# Patient Record
Sex: Female | Born: 1975 | Race: White | Hispanic: No | Marital: Married | Smoking: Never smoker
Health system: Southern US, Community
[De-identification: ages and names within clinical notes are randomized; demographics above are authoritative.]

## PROBLEM LIST (undated history)

## (undated) DIAGNOSIS — M2550 Pain in unspecified joint: Secondary | ICD-10-CM

## (undated) DIAGNOSIS — R002 Palpitations: Secondary | ICD-10-CM

## (undated) DIAGNOSIS — D649 Anemia, unspecified: Secondary | ICD-10-CM

## (undated) DIAGNOSIS — K219 Gastro-esophageal reflux disease without esophagitis: Secondary | ICD-10-CM

## (undated) HISTORY — DX: Gastro-esophageal reflux disease without esophagitis: K21.9

## (undated) HISTORY — DX: Anemia, unspecified: D64.9

## (undated) HISTORY — PX: KNEE ARTHROSCOPY: SUR90

## (undated) HISTORY — PX: SHOULDER SURGERY: SHX246

## (undated) HISTORY — DX: Palpitations: R00.2

## (undated) HISTORY — DX: Pain in unspecified joint: M25.50

---

## 2008-12-22 LAB — CONVERTED CEMR LAB: Pap Smear: NORMAL

## 2009-06-30 ENCOUNTER — Ambulatory Visit: Payer: Self-pay | Admitting: Family Medicine

## 2009-06-30 ENCOUNTER — Encounter: Admission: RE | Admit: 2009-06-30 | Discharge: 2009-06-30 | Payer: Self-pay | Admitting: Family Medicine

## 2009-06-30 DIAGNOSIS — M25559 Pain in unspecified hip: Secondary | ICD-10-CM | POA: Insufficient documentation

## 2009-06-30 HISTORY — DX: Pain in unspecified hip: M25.559

## 2009-07-01 ENCOUNTER — Telehealth: Payer: Self-pay | Admitting: Family Medicine

## 2009-07-12 ENCOUNTER — Telehealth: Payer: Self-pay | Admitting: Family Medicine

## 2010-03-26 ENCOUNTER — Encounter: Payer: Self-pay | Admitting: Family Medicine

## 2010-04-04 NOTE — Progress Notes (Signed)
Summary: discuus results of Xray on hip  Phone Note Call from Patient Call back at Home Phone [phone] Call back at [phone]   Caller: Patient Call For: Nani Gasser MD Summary of Call: Pt calls and wants you to call her about the benign cyst seen on the xray of her hip. I tried telling her what it was but she wants to know how can they tell it is just a cyst and how can they tell its benign. She also called [facility] and they read the report to her then told hwer you could explain it to her Initial call taken by: Kathlene November,  July 01, 2009 9:12 AM  Follow-up for Phone Call        [facility] regarding results. Offered to repeat xray in 2-3 months to asses stability since she is clearly concerned.  Follow-up by: Nani Gasser MD,  Jul 04, 2009 11:47 AM

## 2010-04-04 NOTE — Assessment & Plan Note (Signed)
Summary: NOV: Left hip pain   Vital Signs:  Patient profile:   35 year old female Height:      68.5 inches Weight:      210 pounds BMI:     31.58 Pulse rate:   70 / minute BP sitting:   98 / 66  (left arm) Cuff size:   regular  Vitals Entered By: Kathlene November (June 30, 2009 8:29 AM) CC: NP- left hip pain for 1 1/2 weeks Is Patient Diabetic? No   Primary Care Provider:  Nani Gasser MD  CC:  NP- left hip pain for 1 1/2 weeks.  History of Present Illness: Left hip pain on ly with bearing weight. Started about 1/5 weeks ago. Now radiating down to her knee.  Occ the left hip will pop and seems to "pop out of joint" . Hx of lax joints.  Pain is worse in the AM when sleeps on that side.  Has been trying to wear flat shoes.  Taking IBU- does help.  Did try Aleve yesterday.  Never really happened before.  No numbness or tingling in the leg. Pain is mostly over the groin crease.    She is currently breast feeding.  No trauma or injury.   Habits & Providers  Alcohol-Tobacco-Diet     Alcohol drinks/day: <1     Alcohol type: wine     Tobacco Status: never  Exercise-Depression-Behavior     Does Patient Exercise: no     Have you felt down or hopeless? no     STD Risk: never     Drug Use: no     Seat Belt Use: always  Current Medications (verified): 1)  None  Allergies (verified): 1)  ! Pcn  Comments:  Nurse/Medical Assistant: The patient's medications and allergies were reviewed with the patient and were updated in the Medication and Allergy Lists. Kathlene November (June 30, 2009 8:31 AM)  Past History:  Past Medical History: NOne  Past Surgical History: Left shoulder surgery at aget 35 yo.   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 married to [place] with 2 kids.  Never Smoked Alcohol use-yes Drug use-no Regular  exercise-no 1 caffeinated drink per day. Smoking Status:  never Does Patient Exercise:  no STD Risk:  never Drug Use:  no Seat Belt Use:  always  Review of Systems       No fever/sweats/weakness, unexplained weight loss/gain.  No vison changes.  No difficulty hearing/ringing in ears, hay fever/allergies.  No chest pain/discomfort, palpitations.  No Br lump/nipple discharge.  No cough/wheeze.  No blood in BM, nausea/vomiting/diarrhea.  No nighttime urination, leaking urine, unusual vaginal bleeding, discharge (penis or vagina).  + muscle/joint pain. No rash, change in mole.  No HA, memory loss.  No anxiety, sleep d/o, depression.  No easy bruising/bleeding, unexplained lump   Physical Exam  General:  Well-developed,well-nourished,in no acute distress; alert,appropriate and cooperative throughout examination Msk:  hips with normal flexion, extension, inversion and everesion.  Pin with internal rotation of eth left hip.  Pain with flexion against resistance.  Strength 5/5 in the hips, knees and ankles bilat.  Nontedner over teh greater trochanter or lateral leg.  Extremities:  No Le Edema.   Neurologic:  alert & oriented X3 and gait normal.     Impression & Recommendations:  Problem # 1:  HIP  PAIN, LEFT (ICD-719.45) Discussed either true hip joint pain becaues of the location of her pain vs hip flexion strain.  Will get xrays today to rule out pathology. She also has hs of la joint with her hip "popping in and out" so she may have some cartilage damage as well. Can use her IBU 800mg  up to three times a day as needed . Right now she is using it 1-2 a day. Make sure to take with food.  Also given H.O. on hip exercises for flexor strain to work on for a couple of weeks assuming her xray is relatively normal. Then if not better pt to call and will either consider MRI or refer to ortho.  Orders: T-DG Hip Complete*L* ([id number])  Patient Instructions: 1)  Can take 800mg  Ibuprofen up to three times a  day with food and water.  2)  Given H.O on exercises for the hip as well.  3)  We wil call you with your xray results.   PAP Result Date:  12/22/2008 PAP Result:  normal

## 2010-04-04 NOTE — Progress Notes (Signed)
Summary: need an MRI  Phone Note Call from Patient   Caller: Patient Summary of Call: Dr,Kandee Escalante   Call [facility]  [phone]  Patient was seen a few weeks ago for hip pain and she is no better, was told to call back to get a Refferal for an MRI. Initial call taken by: Vanessa [place],  Jul 12, 2009 2:00 PM  Follow-up for Phone Call        Will schedule.  Follow-up by: Nani Gasser MD,  Jul 12, 2009 4:53 PM

## 2012-02-21 ENCOUNTER — Encounter: Payer: Self-pay | Admitting: Family Medicine

## 2012-02-21 ENCOUNTER — Ambulatory Visit (INDEPENDENT_AMBULATORY_CARE_PROVIDER_SITE_OTHER): Payer: Managed Care, Other (non HMO) | Admitting: Family Medicine

## 2012-02-21 VITALS — BP 118/77 | HR 67 | Ht 68.0 in | Wt 224.0 lb

## 2012-02-21 DIAGNOSIS — Z23 Encounter for immunization: Secondary | ICD-10-CM

## 2012-02-21 DIAGNOSIS — Z1231 Encounter for screening mammogram for malignant neoplasm of breast: Secondary | ICD-10-CM

## 2012-02-21 DIAGNOSIS — Z0000 Encounter for general adult medical examination without abnormal findings: Secondary | ICD-10-CM

## 2012-02-21 DIAGNOSIS — Z803 Family history of malignant neoplasm of breast: Secondary | ICD-10-CM

## 2012-02-21 NOTE — Progress Notes (Signed)
  Subjective:     Tracy Ford is a 36 y.o. female and is here for a comprehensive physical exam. The patient reports no problems. Needs form completed for work.   History   Social History  . Marital Status: Married    Spouse Name: N/A    Number of Children: 2  . 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 Smoking status: Never Smoker   . Smokeless tobacco: Not on file  . Alcohol Use: 1.0 oz/week    2 drink(s) per week  . Drug Use: Not on file  . Sexually Active: Not on file   Other Topics Concern  . Not on file   Social History Narrative   No regular exercise.  1 cup coffee daily.    Health Maintenance  Topic Date Due  . Tetanus/tdap  09/03/1993  . Pap Smear  03/06/2011  . Influenza Vaccine  11/04/2011    The following portions of the patient's history were reviewed and updated as appropriate: allergies, current medications, past family history, past medical history, past social history, past surgical history and problem list.  Review of Systems A comprehensive review of systems was negative.   Objective:    BP 118/77  Pulse 67  Ht 5\' 8"  (1.727 m)  Wt 224 lb (101.606 kg)  BMI 34.06 kg/m2 General appearance: alert, cooperative and appears stated age Head: Normocephalic, without obvious abnormality, atraumatic Eyes: conj clear, EOMi, PEERLA Ears: normal TM's and external ear canals both ears Nose: Nares normal. Septum midline. Mucosa normal. No drainage or sinus tenderness. Throat: lips, mucosa, and tongue normal; teeth and gums normal Neck: no adenopathy, no carotid bruit, no JVD, supple, symmetrical, trachea midline and thyroid not enlarged, symmetric, no tenderness/mass/nodules Back: symmetric, no curvature. ROM normal. No CVA tenderness. Lungs: clear to auscultation bilaterally Heart: regular rate and rhythm, S1, S2 normal, no murmur, click, rub or gallop Abdomen: soft, non-tender; bowel sounds  normal; no masses,  no organomegaly Extremities: extremities normal, atraumatic, no cyanosis or edema Pulses: 2+ and symmetric Skin: Skin color, texture, turgor normal. No rashes or lesions Lymph nodes: Cervical, supraclavicular, and axillary nodes normal. Neurologic: Alert and oriented X 3, normal strength and tone. Normal symmetric reflexes. Normal coordination and gait    Assessment:    Healthy female exam.      Plan:     See After Visit Summary for Counseling Recommendations   Plans on seeing gyn in next couple of month for her breast exam and pap smear.   With her family history of BrCA will start her mammogram screenings.   Keep up a regular exercise program and make sure you are eating a healthy diet Try to eat 4 servings of dairy a day, or if you are lactose intolerant take a calcium with vitamin D daily.  Tdap and flu vaccine given today.  Your vaccines are up to date.

## 2012-02-21 NOTE — Patient Instructions (Addendum)
Keep up a regular exercise program and make sure you are eating a healthy diet Try to eat 4 servings of dairy a day, or if you are lactose intolerant take a calcium with vitamin D daily.  Your vaccines are up to date.   

## 2012-04-29 ENCOUNTER — Ambulatory Visit: Payer: Managed Care, Other (non HMO)

## 2012-05-08 ENCOUNTER — Ambulatory Visit (INDEPENDENT_AMBULATORY_CARE_PROVIDER_SITE_OTHER): Payer: Managed Care, Other (non HMO)

## 2012-05-08 DIAGNOSIS — Z803 Family history of malignant neoplasm of breast: Secondary | ICD-10-CM

## 2012-05-08 DIAGNOSIS — Z1231 Encounter for screening mammogram for malignant neoplasm of breast: Secondary | ICD-10-CM

## 2013-02-24 ENCOUNTER — Ambulatory Visit (INDEPENDENT_AMBULATORY_CARE_PROVIDER_SITE_OTHER): Payer: Commercial Managed Care - PPO | Admitting: Family Medicine

## 2013-02-24 ENCOUNTER — Encounter: Payer: Self-pay | Admitting: Family Medicine

## 2013-02-24 VITALS — BP 120/73 | HR 76 | Temp 98.6°F | Ht 68.0 in | Wt 228.0 lb

## 2013-02-24 DIAGNOSIS — Z0000 Encounter for general adult medical examination without abnormal findings: Secondary | ICD-10-CM

## 2013-02-24 DIAGNOSIS — Z23 Encounter for immunization: Secondary | ICD-10-CM

## 2013-02-24 DIAGNOSIS — M25569 Pain in unspecified knee: Secondary | ICD-10-CM

## 2013-02-24 DIAGNOSIS — M25561 Pain in right knee: Secondary | ICD-10-CM

## 2013-02-24 DIAGNOSIS — Z803 Family history of malignant neoplasm of breast: Secondary | ICD-10-CM

## 2013-02-24 DIAGNOSIS — Z1231 Encounter for screening mammogram for malignant neoplasm of breast: Secondary | ICD-10-CM

## 2013-02-24 HISTORY — DX: Family history of malignant neoplasm of breast: Z80.3

## 2013-02-24 LAB — LIPID PANEL
Cholesterol: 182 mg/dL (ref 0–200)
HDL: 55 mg/dL (ref >39)
LDL Cholesterol: 104 mg/dL — ABNORMAL HIGH (ref 0–99)
Total CHOL/HDL Ratio: 3.3 Ratio
Triglycerides: 114 mg/dL (ref <150)
VLDL: 23 mg/dL (ref 0–40)

## 2013-02-24 LAB — COMPLETE METABOLIC PANEL WITH GFR
ALT: 20 U/L (ref 0–35)
AST: 16 U/L (ref 0–37)
Calcium: 9.1 mg/dL (ref 8.4–10.5)
Chloride: 104 mEq/L (ref 96–112)
Creat: 0.61 mg/dL (ref 0.50–1.10)
GFR, Est African American: >89 mL/min
Total Protein: 6.8 g/dL (ref 6.0–8.3)

## 2013-02-24 NOTE — Progress Notes (Addendum)
  Subjective:     Tracy Ford is a 37 y.o. female and is here for a comprehensive physical exam. The patient reports problems - right knee feels tight and swollen after went to trampoline park after Thanksiving. .has been swollen since then. Using Aleve and has been helped. Occ uncomfortable but not really in a lot of pain. Says didn't hurt during the jumping. Just got gradually swollen and sore afterwards.   History   Social History  . Marital Status: Married    Spouse Name: N/A    Number of Children: 2  . 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 Smoking status: Never Smoker   . Smokeless tobacco: Not on file  . Alcohol Use: 1.0 oz/week    2 drink(s) per week  . Drug Use: Not on file  . Sexual Activity: Not on file   Other Topics Concern  . Not on file   Social History Narrative   No regular exercise.  1 cup coffee daily.    Health Maintenance  Topic Date Due  . Pap Smear  12/23/2011  . Influenza Vaccine  10/03/2012  . Tetanus/tdap  02/20/2022    The following portions of the patient's history were reviewed and updated as appropriate: allergies, current medications, past family history, past medical history, past social history, past surgical history and problem list.  Review of Systems A comprehensive review of systems was negative.   Objective:    BP 120/73  Pulse 76  Temp(Src) 98.6 F (37 C)  Ht 5\' 8"  (1.727 m)  Wt 228 lb (103.42 kg)  BMI 34.68 kg/m2 General appearance: alert, cooperative and appears stated age Head: Normocephalic, without obvious abnormality, atraumatic Eyes: conj clear, EOMi, PEERLA Ears: normal TM's and external ear canals both ears Nose: Nares normal. Septum midline. Mucosa normal. No drainage or sinus tenderness. Throat: lips, mucosa, and tongue normal; teeth and gums normal Neck: no adenopathy, no carotid bruit, no JVD, supple, symmetrical, trachea midline and thyroid  not enlarged, symmetric, no tenderness/mass/nodules Back: symmetric, no curvature. ROM normal. No CVA tenderness. Lungs: clear to auscultation bilaterally Heart: regular rate and rhythm, S1, S2 normal, no murmur, click, rub or gallop Abdomen: soft, non-tender; bowel sounds normal; no masses,  no organomegaly Extremities: extremities normal, atraumatic, no cyanosis or edema Pulses: 2+ and symmetric Skin: Skin color, texture, turgor normal. No rashes or lesions Lymph nodes: Cervical, supraclavicular, and axillary nodes normal. Neurologic: Alert and oriented X 3, normal strength and tone. Normal symmetric reflexes. Normal coordination and gait   Right knee pain - Trace edema.  Normal flexion, extension, no crepitus.  Nontender along the joint lines.  Neg McMurrays.     Assessment:    Healthy female exam.      Plan:     See After Visit Summary for Counseling Recommendations  Keep up a regular exercise program and make sure you are eating a healthy diet Try to eat 4 servings of dairy a day, or if you are lactose intolerant take a calcium with vitamin D daily.  Your vaccines are up to date. Flu vaccine given today.  Neg depression screen.   Due for screening lipids/CMP.   Right knee Pain - Suspect cartilage tear.  Continue NSAID, icing, etc. She feel has been getting better, If not improved after 1 mo or swelling gets worse.  Will refer to Dr. Karie Schwalbe iuf needed.

## 2013-02-24 NOTE — Addendum Note (Signed)
Addended by: Nani Gasser D on: 02/24/2013 09:00 AM   Modules accepted: Orders, Level of Service

## 2013-02-24 NOTE — Patient Instructions (Signed)
Keep up a regular exercise program and make sure you are eating a healthy diet Try to eat 4 servings of dairy a day, or if you are lactose intolerant take a calcium with vitamin D daily.  Your vaccines are up to date.   

## 2013-03-10 ENCOUNTER — Telehealth: Payer: Self-pay | Admitting: *Deleted

## 2013-03-10 NOTE — Telephone Encounter (Signed)
Informed that Insurance may not pay for her to have mammo done due to her age. [name] will call and explain to pt.Tracy Ford, Tracy Ford

## 2013-05-12 ENCOUNTER — Ambulatory Visit (INDEPENDENT_AMBULATORY_CARE_PROVIDER_SITE_OTHER): Payer: Commercial Managed Care - PPO

## 2013-05-12 DIAGNOSIS — Z1231 Encounter for screening mammogram for malignant neoplasm of breast: Secondary | ICD-10-CM

## 2013-05-12 DIAGNOSIS — Z803 Family history of malignant neoplasm of breast: Secondary | ICD-10-CM

## 2013-12-11 ENCOUNTER — Ambulatory Visit (INDEPENDENT_AMBULATORY_CARE_PROVIDER_SITE_OTHER): Payer: Commercial Managed Care - PPO

## 2013-12-11 ENCOUNTER — Ambulatory Visit (INDEPENDENT_AMBULATORY_CARE_PROVIDER_SITE_OTHER): Payer: Commercial Managed Care - PPO | Admitting: Sports Medicine

## 2013-12-11 ENCOUNTER — Encounter: Payer: Self-pay | Admitting: Sports Medicine

## 2013-12-11 VITALS — BP 133/88 | HR 76 | Ht 68.0 in | Wt 230.0 lb

## 2013-12-11 DIAGNOSIS — M25461 Effusion, right knee: Secondary | ICD-10-CM

## 2013-12-11 DIAGNOSIS — M25561 Pain in right knee: Secondary | ICD-10-CM

## 2013-12-11 DIAGNOSIS — M25562 Pain in left knee: Secondary | ICD-10-CM

## 2013-12-11 HISTORY — DX: Pain in right knee: M25.561

## 2013-12-11 NOTE — Progress Notes (Signed)
   Subjective:    I'm seeing this patient as a consultation for:  Dr. Linford ArnoldMetheney  CC: Right knee pain  HPI: This is a very pleasant 38 year old female with a history of osteoarthritis who comes in with a several week history of pain and swelling in her right knee after starting a running program. Pain is moderate, persistent, under the kneecap as well as at the joint lines without radiation. No mechanical symptoms.  Past medical history, Surgical history, Family history not pertinant except as noted below, Social history, Allergies, and medications have been entered into the medical record, reviewed, and no changes needed.   Review of Systems: No headache, visual changes, nausea, vomiting, diarrhea, constipation, dizziness, abdominal pain, skin rash, fevers, chills, night sweats, weight loss, swollen lymph nodes, body aches, joint swelling, muscle aches, chest pain, shortness of breath, mood changes, visual or auditory hallucinations.   Objective:   General: Well Developed, well nourished, and in no acute distress.  Neuro/Psych: Alert and oriented x3, extra-ocular muscles intact, able to move all 4 extremities, sensation grossly intact. Skin: Warm and dry, no rashes noted.  Respiratory: Not using accessory muscles, speaking in full sentences, trachea midline.  Cardiovascular: Pulses palpable, no extremity edema. Abdomen: Does not appear distended.  Procedure: Real-time Ultrasound Guided aspiration/Injection of right knee Device: GE Logiq E  Verbal informed consent obtained.  Time-out conducted.  Noted no overlying erythema, induration, or other signs of local infection.  Skin prepped in a sterile fashion.  Local anesthesia: Topical Ethyl chloride.  With sterile technique and under real time ultrasound guidance:  Aspirated 25 cc of straw-colored fluid, syringe switched and 2 cc kenalog 40, 4 cc lidocaine injected easily. Completed without difficulty  Pain immediately resolved suggesting  accurate placement of the medication.  Advised to call if fevers/chills, erythema, induration, drainage, or persistent bleeding.  Images permanently stored and available for review in the ultrasound unit.  Impression: Technically successful ultrasound guided injection.  Impression and Recommendations:   This case required medical decision making of moderate complexity.

## 2013-12-11 NOTE — Assessment & Plan Note (Addendum)
Pain under the patellar facets with a significant effusion, and joint line pain. This likely represents a degenerative meniscal injury with osteoarthritis. Aspiration of 25cc and injection as above, physical therapy, x-rays, continue over-the-counter NSAIDs.

## 2013-12-14 ENCOUNTER — Ambulatory Visit: Payer: Commercial Managed Care - PPO | Admitting: Sports Medicine

## 2013-12-22 ENCOUNTER — Ambulatory Visit (INDEPENDENT_AMBULATORY_CARE_PROVIDER_SITE_OTHER): Payer: Commercial Managed Care - PPO | Admitting: Physical Therapy

## 2013-12-22 DIAGNOSIS — M25669 Stiffness of unspecified knee, not elsewhere classified: Secondary | ICD-10-CM

## 2013-12-22 DIAGNOSIS — R5381 Other malaise: Secondary | ICD-10-CM

## 2013-12-22 DIAGNOSIS — M25561 Pain in right knee: Secondary | ICD-10-CM

## 2013-12-28 ENCOUNTER — Encounter (INDEPENDENT_AMBULATORY_CARE_PROVIDER_SITE_OTHER): Payer: Commercial Managed Care - PPO | Admitting: Physical Therapy

## 2013-12-28 DIAGNOSIS — R5381 Other malaise: Secondary | ICD-10-CM

## 2013-12-28 DIAGNOSIS — M25669 Stiffness of unspecified knee, not elsewhere classified: Secondary | ICD-10-CM

## 2013-12-28 DIAGNOSIS — M25561 Pain in right knee: Secondary | ICD-10-CM

## 2013-12-30 ENCOUNTER — Encounter: Payer: Commercial Managed Care - PPO | Admitting: Physical Therapy

## 2014-01-04 ENCOUNTER — Encounter (INDEPENDENT_AMBULATORY_CARE_PROVIDER_SITE_OTHER): Payer: Commercial Managed Care - PPO | Admitting: Physical Therapy

## 2014-01-04 DIAGNOSIS — M25669 Stiffness of unspecified knee, not elsewhere classified: Secondary | ICD-10-CM

## 2014-01-04 DIAGNOSIS — M25561 Pain in right knee: Secondary | ICD-10-CM

## 2014-01-04 DIAGNOSIS — R5381 Other malaise: Secondary | ICD-10-CM

## 2014-01-06 ENCOUNTER — Encounter (INDEPENDENT_AMBULATORY_CARE_PROVIDER_SITE_OTHER): Payer: Commercial Managed Care - PPO | Admitting: Physical Therapy

## 2014-01-06 DIAGNOSIS — R5381 Other malaise: Secondary | ICD-10-CM

## 2014-01-06 DIAGNOSIS — M25669 Stiffness of unspecified knee, not elsewhere classified: Secondary | ICD-10-CM

## 2014-01-06 DIAGNOSIS — M25561 Pain in right knee: Secondary | ICD-10-CM

## 2014-01-11 ENCOUNTER — Encounter (INDEPENDENT_AMBULATORY_CARE_PROVIDER_SITE_OTHER): Payer: Commercial Managed Care - PPO | Admitting: Physical Therapy

## 2014-01-11 DIAGNOSIS — R5381 Other malaise: Secondary | ICD-10-CM

## 2014-01-11 DIAGNOSIS — M25561 Pain in right knee: Secondary | ICD-10-CM

## 2014-01-11 DIAGNOSIS — M25669 Stiffness of unspecified knee, not elsewhere classified: Secondary | ICD-10-CM

## 2014-01-14 ENCOUNTER — Telehealth: Payer: Self-pay | Admitting: *Deleted

## 2014-01-14 ENCOUNTER — Ambulatory Visit (INDEPENDENT_AMBULATORY_CARE_PROVIDER_SITE_OTHER): Payer: Commercial Managed Care - PPO | Admitting: Sports Medicine

## 2014-01-14 DIAGNOSIS — M25561 Pain in right knee: Secondary | ICD-10-CM

## 2014-01-14 NOTE — Progress Notes (Signed)
  Subjective:    CC: follow-up  HPI: Right knee pain: We aspirated and injected Kim's knee at the last visit, she had an injury while running. Pain is approximately 60% improved and swelling has not returned however she has no confidence in the knee and continues to have pain at the medial joint line. She is continuing to do physical therapy. Pain is mild, persistent. No mechanical symptoms.  Past medical history, Surgical history, Family history not pertinant except as noted below, Social history, Allergies, and medications have been entered into the medical record, reviewed, and no changes needed.   Review of Systems: No fevers, chills, night sweats, weight loss, chest pain, or shortness of breath.   Objective:    General: Well Developed, well nourished, and in no acute distress.  Neuro: Alert and oriented x3, extra-ocular muscles intact, sensation grossly intact.  HEENT: Normocephalic, atraumatic, pupils equal round reactive to light, neck supple, no masses, no lymphadenopathy, thyroid nonpalpable.  Skin: Warm and dry, no rashes. Cardiac: Regular rate and rhythm, no murmurs rubs or gallops, no lower extremity edema.  Respiratory: Clear to auscultation bilaterally. Not using accessory muscles, speaking in full sentences. Right Knee: Normal to inspection with no erythema or effusion or obvious bony abnormalities. Moderate tenderness at the medial joint line. ROM normal in flexion and extension and lower leg rotation. Ligaments with solid consistent endpoints including ACL, PCL, LCL, MCL. Negative Mcmurray's and provocative meniscal tests. Non painful patellar compression. Patellar and quadriceps tendons unremarkable. Hamstring and quadriceps strength is normal.  Impression and Recommendations:

## 2014-01-14 NOTE — Assessment & Plan Note (Signed)
Persistent pain despite aspiration, injection, and physical therapy. There is likely a complex medial meniscal tear. MRI, referral to orthopedic surgery.

## 2014-01-14 NOTE — Telephone Encounter (Signed)
No prior auth required for MRI knee as per [facility] portal. Corliss SkainsJamie Vyom Brass, CMA

## 2014-01-23 ENCOUNTER — Ambulatory Visit (HOSPITAL_BASED_OUTPATIENT_CLINIC_OR_DEPARTMENT_OTHER)
Admission: RE | Admit: 2014-01-23 | Discharge: 2014-01-23 | Disposition: A | Discharge status: Home / Self Care | Payer: Commercial Managed Care - PPO | Source: Ambulatory Visit | Attending: Sports Medicine | Admitting: Sports Medicine

## 2014-01-23 DIAGNOSIS — X58XXXA Exposure to other specified factors, initial encounter: Secondary | ICD-10-CM | POA: Insufficient documentation

## 2014-01-23 DIAGNOSIS — S83241A Other tear of medial meniscus, current injury, right knee, initial encounter: Secondary | ICD-10-CM | POA: Diagnosis not present

## 2014-01-23 DIAGNOSIS — M25561 Pain in right knee: Secondary | ICD-10-CM | POA: Insufficient documentation

## 2014-01-23 DIAGNOSIS — M25461 Effusion, right knee: Secondary | ICD-10-CM | POA: Diagnosis not present

## 2014-01-23 DIAGNOSIS — Y9302 Activity, running: Secondary | ICD-10-CM | POA: Diagnosis not present

## 2014-01-23 DIAGNOSIS — M25661 Stiffness of right knee, not elsewhere classified: Secondary | ICD-10-CM | POA: Diagnosis not present

## 2014-02-23 ENCOUNTER — Ambulatory Visit (INDEPENDENT_AMBULATORY_CARE_PROVIDER_SITE_OTHER): Payer: Commercial Managed Care - PPO | Admitting: Family Medicine

## 2014-02-23 ENCOUNTER — Other Ambulatory Visit (HOSPITAL_COMMUNITY)
Admission: RE | Admit: 2014-02-23 | Discharge: 2014-02-23 | Disposition: A | Discharge status: Home / Self Care | Payer: Commercial Managed Care - PPO | Source: Ambulatory Visit | Attending: Family Medicine | Admitting: Family Medicine

## 2014-02-23 ENCOUNTER — Encounter: Payer: Self-pay | Admitting: Family Medicine

## 2014-02-23 VITALS — BP 123/81 | HR 68 | Ht 68.0 in | Wt 230.0 lb

## 2014-02-23 DIAGNOSIS — Z01419 Encounter for gynecological examination (general) (routine) without abnormal findings: Secondary | ICD-10-CM | POA: Insufficient documentation

## 2014-02-23 DIAGNOSIS — Z803 Family history of malignant neoplasm of breast: Secondary | ICD-10-CM

## 2014-02-23 DIAGNOSIS — Z1151 Encounter for screening for human papillomavirus (HPV): Secondary | ICD-10-CM | POA: Diagnosis present

## 2014-02-23 DIAGNOSIS — Z1231 Encounter for screening mammogram for malignant neoplasm of breast: Secondary | ICD-10-CM

## 2014-02-23 LAB — POCT GLYCOSYLATED HEMOGLOBIN (HGB A1C): HEMOGLOBIN A1C: 5

## 2014-02-23 NOTE — Patient Instructions (Signed)
Keep up a regular exercise program and make sure you are eating a healthy diet Try to eat 4 servings of dairy a day, or if you are lactose intolerant take a calcium with vitamin D daily.  Your vaccines are up to date.   

## 2014-02-23 NOTE — Progress Notes (Signed)
  Subjective:     Tracy Ford is a 38 y.o. female and is here for a comprehensive physical exam. The patient reports no problems. She reports normal menstrual cycles and no abnormalities with discharge or pelvic pain. No constipation or bowel pounds. No recent chest pain or short of breath with exercise. No recent hearing or vision changes. Her flu vaccine is up-to-date. Tetanus is up-to-date.  History   Social History  . Marital Status: Married    Spouse Name: N/A    Number of Children: 2  . 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 Smoking status: Never Smoker   . Smokeless tobacco: Not on file  . Alcohol Use: 1.0 oz/week    2 drink(s) per week  . Drug Use: Not on file  . Sexual Activity: Not on file   Other Topics Concern  . Not on file   Social History Narrative   No regular exercise.  1 cup coffee daily.    Health Maintenance  Topic Date Due  . PAP SMEAR  12/23/2011  . INFLUENZA VACCINE  10/04/2014  . TETANUS/TDAP  02/20/2022    The following portions of the patient's history were reviewed and updated as appropriate: allergies, current medications, past family history, past medical history, past social history, past surgical history and problem list.  Review of Systems A comprehensive review of systems was negative.   Objective:    BP 123/81 mmHg  Pulse 68  Ht 5\' 8"  (1.727 m)  Wt 230 lb (104.327 kg)  BMI 34.98 kg/m2  SpO2 99% General appearance: alert, cooperative and appears stated age Head: Normocephalic, without obvious abnormality, atraumatic Eyes: conj clear, EOMI, PEERLA Ears: normal TM's and external ear canals both ears Nose: Nares normal. Septum midline. Mucosa normal. No drainage or sinus tenderness. Throat: lips, mucosa, and tongue normal; teeth and gums normal Neck: no adenopathy, no carotid bruit, no JVD, supple, symmetrical, trachea midline and thyroid not enlarged, symmetric,  no tenderness/mass/nodules Back: symmetric, no curvature. ROM normal. No CVA tenderness. Lungs: clear to auscultation bilaterally Breasts: normal appearance, no masses or tenderness Heart: regular rate and rhythm, S1, S2 normal, no murmur, click, rub or gallop Abdomen: soft, non-tender; bowel sounds normal; no masses,  no organomegaly Pelvic: cervix normal in appearance, external genitalia normal, no adnexal masses or tenderness, no cervical motion tenderness, rectovaginal septum normal, uterus normal size, shape, and consistency and vagina normal without discharge Extremities: extremities normal, atraumatic, no cyanosis or edema Pulses: 2+ and symmetric Skin: Skin color, texture, turgor normal. No rashes or lesions Lymph nodes: Cervical, supraclavicular, and axillary nodes normal. Neurologic: Alert and oriented X 3, normal strength and tone. Normal symmetric reflexes. Normal coordination and gait    Assessment:    Healthy female exam.      Plan:     See After Visit Summary for Counseling Recommendations  Keep up a regular exercise program and make sure you are eating a healthy diet Try to eat 4 servings of dairy a day, or if you are lactose intolerant take a calcium with vitamin D daily.  Your vaccines are up to date.

## 2014-02-24 LAB — CYTOLOGY - PAP

## 2014-10-27 ENCOUNTER — Encounter: Payer: Self-pay | Admitting: *Deleted

## 2014-10-27 ENCOUNTER — Emergency Department (INDEPENDENT_AMBULATORY_CARE_PROVIDER_SITE_OTHER)
Admission: EM | Admit: 2014-10-27 | Discharge: 2014-10-27 | Disposition: A | Discharge status: Home / Self Care | Payer: Commercial Managed Care - PPO | Source: Home / Self Care

## 2014-10-27 DIAGNOSIS — Z23 Encounter for immunization: Secondary | ICD-10-CM | POA: Diagnosis not present

## 2014-10-27 MED ORDER — TETANUS-DIPHTH-ACELL PERTUSSIS 5-2.5-18.5 LF-MCG/0.5 IM SUSP
0.5000 mL | Freq: Once | INTRAMUSCULAR | Status: AC
  Administered 2014-10-27: 0.5 mL via INTRAMUSCULAR

## 2014-10-27 NOTE — ED Notes (Signed)
Tracy Ford is here today for a Tdap vaccine.

## 2016-03-20 ENCOUNTER — Other Ambulatory Visit: Payer: Self-pay

## 2016-03-20 ENCOUNTER — Other Ambulatory Visit (HOSPITAL_COMMUNITY)
Admission: RE | Admit: 2016-03-20 | Discharge: 2016-03-20 | Disposition: A | Discharge status: Home / Self Care | Payer: Managed Care, Other (non HMO) | Source: Ambulatory Visit | Attending: Family Medicine | Admitting: Family Medicine

## 2016-03-20 ENCOUNTER — Encounter: Payer: Self-pay | Admitting: Family Medicine

## 2016-03-20 ENCOUNTER — Ambulatory Visit (INDEPENDENT_AMBULATORY_CARE_PROVIDER_SITE_OTHER): Payer: Managed Care, Other (non HMO) | Admitting: Family Medicine

## 2016-03-20 VITALS — BP 112/70 | HR 65 | Ht 68.0 in | Wt 204.0 lb

## 2016-03-20 DIAGNOSIS — Z01419 Encounter for gynecological examination (general) (routine) without abnormal findings: Secondary | ICD-10-CM | POA: Insufficient documentation

## 2016-03-20 DIAGNOSIS — N841 Polyp of cervix uteri: Secondary | ICD-10-CM | POA: Diagnosis not present

## 2016-03-20 DIAGNOSIS — Z0000 Encounter for general adult medical examination without abnormal findings: Secondary | ICD-10-CM

## 2016-03-20 DIAGNOSIS — Z114 Encounter for screening for human immunodeficiency virus [HIV]: Secondary | ICD-10-CM | POA: Diagnosis not present

## 2016-03-20 DIAGNOSIS — Z1231 Encounter for screening mammogram for malignant neoplasm of breast: Secondary | ICD-10-CM | POA: Diagnosis not present

## 2016-03-20 LAB — COMPLETE METABOLIC PANEL WITH GFR
ALBUMIN: 4.2 g/dL (ref 3.6–5.1)
ALK PHOS: 48 U/L (ref 33–115)
ALT: 22 U/L (ref 6–29)
AST: 15 U/L (ref 10–30)
BILIRUBIN TOTAL: 0.5 mg/dL (ref 0.2–1.2)
BUN: 10 mg/dL (ref 7–25)
CO2: 27 mmol/L (ref 20–31)
Calcium: 9.1 mg/dL (ref 8.6–10.2)
Chloride: 104 mmol/L (ref 98–110)
Creat: 0.68 mg/dL (ref 0.50–1.10)
GFR, Est African American: >89 mL/min (ref >=60)
GFR, Est Non African American: >89 mL/min (ref >=60)
GLUCOSE: 91 mg/dL (ref 65–99)
POTASSIUM: 4.1 mmol/L (ref 3.5–5.3)
SODIUM: 140 mmol/L (ref 135–146)
TOTAL PROTEIN: 6.9 g/dL (ref 6.1–8.1)

## 2016-03-20 LAB — LIPID PANEL
CHOL/HDL RATIO: 2.6 ratio (ref <5.0)
Cholesterol: 161 mg/dL (ref <200)
HDL: 61 mg/dL (ref >50)
LDL CALC: 80 mg/dL (ref <100)
Triglycerides: 98 mg/dL (ref <150)
VLDL: 20 mg/dL (ref <30)

## 2016-03-20 LAB — HEMOGLOBIN A1C
HEMOGLOBIN A1C: 4.5 % (ref <5.7)
MEAN PLASMA GLUCOSE: 82 mg/dL

## 2016-03-20 NOTE — Patient Instructions (Signed)
Keep up a regular exercise program and make sure you are eating a healthy diet Try to eat 4 servings of dairy a day, or if you are lactose intolerant take a calcium with vitamin D daily.  Your vaccines are up to date.   

## 2016-03-20 NOTE — Addendum Note (Signed)
Addended by: Deno EtienneBARKLEY, Desjuan Stearns L on: 03/20/2016 09:50 AM   Modules accepted: Orders

## 2016-03-20 NOTE — Progress Notes (Signed)
Subjective:     Tracy Ford is a 41 y.o. female and is here for a comprehensive physical exam. The patient reports no problems.  She is doing well overall. She has lost about 30 pounds since I last saw her several years ago. She is currently doing Weight Watchers. She's not actively exercising but plans to. She does take a multivitamin daily.  Social History   Social History  . Marital status: Married    Spouse name: Joe  . Number of children: 2  . Years of education: N/A   Occupational History  . 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 Smoking status: Never Smoker  . Smokeless tobacco: Not on file  . Alcohol use 1.0 oz/week    2 Standard drinks or equivalent per week  . Drug use: No  . Sexual activity: Not on file   Other Topics Concern  . Not on file   Social History Narrative   Some regular exercise.  1 cup coffee daily.    Health Maintenance  Topic Date Due  . HIV Screening  09/03/1989  . INFLUENZA VACCINE  10/04/2015  . PAP SMEAR  02/23/2017  . TETANUS/TDAP  10/26/2024    The following portions of the patient's history were reviewed and updated as appropriate: allergies, current medications, past family history, past medical history, past social history, past surgical history and problem list.  Review of Systems A comprehensive review of systems was negative.   Objective:    BP 112/70   Pulse 65   Ht 5\' 8"  (1.727 m)   Wt 204 lb (92.5 kg)   SpO2 100%   BMI 31.02 kg/m  General appearance: alert, cooperative and appears stated age Head: Normocephalic, without obvious abnormality, atraumatic Eyes: conj clear, EOMI, PEERLA Ears: normal TM's and external ear canals both ears Nose: Nares normal. Septum midline. Mucosa normal. No drainage or sinus tenderness. Throat: lips, mucosa, and tongue normal; teeth and gums normal Neck: no adenopathy, no carotid bruit, no JVD, supple, symmetrical, trachea midline and thyroid not enlarged, symmetric, no  tenderness/mass/nodules Back: symmetric, no curvature. ROM normal. No CVA tenderness. Lungs: clear to auscultation bilaterally Breasts: normal appearance, no masses or tenderness Heart: regular rate and rhythm, S1, S2 normal, no murmur, click, rub or gallop Abdomen: soft, non-tender; bowel sounds normal; no masses,  no organomegaly Pelvic: cervix normal in appearance, external genitalia normal, no adnexal masses or tenderness, no cervical motion tenderness, rectovaginal septum normal, uterus normal size, shape, and consistency, vagina normal without discharge and approx  < 1 cm cervical polyp Extremities: extremities normal, atraumatic, no cyanosis or edema Pulses: 2+ and symmetric Skin: Skin color, texture, turgor normal. No rashes or lesions Lymph nodes: Cervical, supraclavicular, and axillary nodes normal. Neurologic: Alert and oriented X 3, normal strength and tone. Normal symmetric reflexes. Normal coordination and gait    Procedure:  Patient identified of the presence of the polyp. I let her know that it would cause some discomfort and a little bleeding. Use forceps to pinch off the polyp and sent for pathology.   Assessment:    Healthy female exam.      Plan:     See After Visit Summary for Counseling Recommendations   complete physical examination Keep up a regular exercise program and make sure you are eating a healthy diet Try to eat 4 servings of dairy a day, or if you are lactose intolerant take a calcium with vitamin D daily.  Your vaccines are  up to date.  Will schedule Mammo   Cervical Polyp - Forceps used to clip and remove the polyp. Sent to pathology for further evaluation. Call with results once available.

## 2016-03-21 LAB — CYTOLOGY - PAP: Diagnosis: NEGATIVE

## 2016-03-21 LAB — HIV ANTIBODY (ROUTINE TESTING W REFLEX): HIV 1&2 Ab, 4th Generation: NONREACTIVE

## 2016-03-23 ENCOUNTER — Ambulatory Visit (INDEPENDENT_AMBULATORY_CARE_PROVIDER_SITE_OTHER): Payer: Managed Care, Other (non HMO)

## 2016-03-23 DIAGNOSIS — R928 Other abnormal and inconclusive findings on diagnostic imaging of breast: Secondary | ICD-10-CM

## 2016-03-23 DIAGNOSIS — Z1231 Encounter for screening mammogram for malignant neoplasm of breast: Secondary | ICD-10-CM

## 2016-03-27 ENCOUNTER — Other Ambulatory Visit: Payer: Self-pay | Admitting: Family Medicine

## 2016-03-27 DIAGNOSIS — R928 Other abnormal and inconclusive findings on diagnostic imaging of breast: Secondary | ICD-10-CM

## 2016-04-02 ENCOUNTER — Ambulatory Visit
Admission: RE | Admit: 2016-04-02 | Discharge: 2016-04-02 | Disposition: A | Discharge status: Home / Self Care | Payer: Managed Care, Other (non HMO) | Source: Ambulatory Visit | Attending: Family Medicine | Admitting: Family Medicine

## 2016-04-02 DIAGNOSIS — R928 Other abnormal and inconclusive findings on diagnostic imaging of breast: Secondary | ICD-10-CM

## 2016-04-18 ENCOUNTER — Encounter: Payer: Self-pay | Admitting: Emergency Medicine

## 2016-04-18 ENCOUNTER — Emergency Department (INDEPENDENT_AMBULATORY_CARE_PROVIDER_SITE_OTHER)
Admission: EM | Admit: 2016-04-18 | Discharge: 2016-04-18 | Disposition: A | Discharge status: Home / Self Care | Payer: Managed Care, Other (non HMO) | Source: Home / Self Care | Attending: Family Medicine | Admitting: Family Medicine

## 2016-04-18 DIAGNOSIS — J019 Acute sinusitis, unspecified: Secondary | ICD-10-CM | POA: Diagnosis not present

## 2016-04-18 MED ORDER — FLUTICASONE PROPIONATE 50 MCG/ACT NA SUSP: 2.0000 | Freq: Every day | NASAL | 2 refills | Status: AC

## 2016-04-18 MED ORDER — AZITHROMYCIN 250 MG PO TABS: 250.0000 mg | ORAL_TABLET | Freq: Every day | ORAL | 0 refills | Status: DC

## 2016-04-18 NOTE — ED Provider Notes (Signed)
CSN: [id number]     Arrival date & time 04/18/16  4098 History   First MD Initiated Contact with Patient 04/18/16 [phone]     Chief Complaint  Patient presents with  . Facial Pain   (Consider location/radiation/quality/duration/timing/severity/associated sxs/prior Treatment) HPI  Tracy Ford is a 41 y.o. female presenting to UC with c/o 1 week of gradually worsening facial pain and pressure with sinus congestion. Pain is worse on Left side of face. Upper teeth have also been sore. Mild intermittent productive cough from post-nasal drip.  Denies known fever. Denies n/v/d.  No known sick contacts or recent travel.    No past medical history on file. Past Surgical History:  Procedure Laterality Date  . KNEE ARTHROSCOPY     right knee for torn meniscus, Dr. Luiz Blare  . SHOULDER SURGERY     Left shoulder or chonic dislocations   Family History  Problem Relation Age of Onset  . Breast cancer Mother 47    x 2  . Prostate cancer Maternal Uncle   . Stroke Paternal Grandmother   . Alzheimer's disease Paternal Grandmother    Social History  Substance Use Topics  . Smoking status: Never Smoker  . Smokeless tobacco: Never Used  . Alcohol use 1.0 oz/week    2 Standard drinks or equivalent per week   OB History    No data available     Review of Systems  Constitutional: Negative for chills and fever.  HENT: Positive for congestion, rhinorrhea, sinus pain and sinus pressure. Negative for ear pain, sore throat, trouble swallowing and voice change.   Respiratory: Positive for cough. Negative for shortness of breath.   Cardiovascular: Negative for chest pain and palpitations.  Gastrointestinal: Negative for abdominal pain, diarrhea, nausea and vomiting.  Musculoskeletal: Negative for arthralgias, back pain and myalgias.  Skin: Negative for rash.  Neurological: Positive for headaches ( frontal). Negative for dizziness and light-headedness.    Allergies  Penicillins  Home Medications    Prior to Admission medications   Medication Sig Start Date End Date Taking? Authorizing Provider  azithromycin (ZITHROMAX) 250 MG tablet Take 1 tablet (250 mg total) by mouth daily. Take first 2 tablets together, then 1 every day until finished. 04/18/16   Junius Finner, PA-C  fluticasone (FLONASE) 50 MCG/ACT nasal spray Place 2 sprays into both nostrils daily. 04/18/16   Junius Finner, PA-C   Meds Ordered and Administered this Visit  Medications - No data to display  BP 126/87 (BP Location: Left Arm)   Pulse 65   Temp 97.6 F (36.4 C) (Oral)   Ht 5\' 8"  (1.727 m)   Wt 206 lb (93.4 kg)   LMP 04/11/2016 (Approximate)   SpO2 100%   BMI 31.32 kg/m  No data found.   Physical Exam  Constitutional: She is oriented to person, place, and time. She appears well-developed and well-nourished. No distress.  HENT:  Head: Normocephalic and atraumatic.  Right Ear: Tympanic membrane normal.  Left Ear: Tympanic membrane normal.  Nose: Mucosal edema present. Right sinus exhibits no maxillary sinus tenderness and no frontal sinus tenderness. Left sinus exhibits maxillary sinus tenderness and frontal sinus tenderness.  Mouth/Throat: Uvula is midline, oropharynx is clear and moist and mucous membranes are normal.  Eyes: EOM are normal.  Neck: Normal range of motion. Neck supple.  Cardiovascular: Normal rate and regular rhythm.   Pulmonary/Chest: Effort normal and breath sounds normal. No stridor. No respiratory distress. She has no wheezes. She has no rales.  Musculoskeletal: Normal  range of motion.  Lymphadenopathy:    She has no cervical adenopathy.  Neurological: She is alert and oriented to person, place, and time.  Skin: Skin is warm and dry. She is not diaphoretic.  Psychiatric: She has a normal mood and affect. Her behavior is normal.  Nursing note and vitals reviewed.   Urgent Care Course     Procedures (including critical care time)  Labs Review Labs Reviewed - No data to  display  Imaging Review No results found.   MDM   1. Acute rhinosinusitis    Hx and exam c/w sinusitis. Pt allergic to PCN, has done well on Azithromycin in the past.  Rx: Azithromycin and Flonase. Encouraged sinus rinses. F/u with PCP in 1 week if not improving.     Junius Finnerrin O'Malley, PA-C 04/18/16 1202

## 2016-04-18 NOTE — ED Triage Notes (Signed)
Congestion, facial pain, teeth hurt, cough x 1 week

## 2018-01-20 LAB — LIPID PANEL
Cholesterol: 193 (ref 0–200)
HDL: 68 (ref 35–70)
LDL Cholesterol: 109
Triglycerides: 82 (ref 40–160)

## 2018-01-20 LAB — BASIC METABOLIC PANEL: Glucose: 105

## 2019-02-04 ENCOUNTER — Encounter: Payer: Self-pay | Admitting: Family Medicine

## 2019-02-20 ENCOUNTER — Emergency Department (INDEPENDENT_AMBULATORY_CARE_PROVIDER_SITE_OTHER)
Admission: EM | Admit: 2019-02-20 | Discharge: 2019-02-20 | Disposition: A | Discharge status: Home / Self Care | Payer: Managed Care, Other (non HMO) | Source: Home / Self Care | Attending: Family Medicine | Admitting: Family Medicine

## 2019-02-20 ENCOUNTER — Encounter: Payer: Self-pay | Admitting: Emergency Medicine

## 2019-02-20 ENCOUNTER — Other Ambulatory Visit: Payer: Self-pay

## 2019-02-20 DIAGNOSIS — Z20828 Contact with and (suspected) exposure to other viral communicable diseases: Secondary | ICD-10-CM

## 2019-02-20 DIAGNOSIS — Z20822 Contact with and (suspected) exposure to covid-19: Secondary | ICD-10-CM

## 2019-02-20 NOTE — ED Triage Notes (Signed)
PT is here for  covid test, her boss tsted POS yesterday, She is asymtomatic

## 2019-02-20 NOTE — ED Provider Notes (Signed)
[facility]    CSN: [id number] Arrival date & time: 02/20/19  0920      History   Chief Complaint Chief Complaint  Patient presents with  . Exposure to COVID    HPI Tracy Ford is a 43 y.o. female.   Patient desires COVID19 testing.  She reports that her boss has tested positive for COVID19, but she is completely assymptomatic at present.  The history is provided by the patient.    History reviewed. No pertinent past medical history.  Patient Active Problem List   Diagnosis Date Noted  . Right knee pain 12/11/2013  . Family history of breast cancer in first degree relative 02/24/2013  . HIP PAIN, LEFT 06/30/2009    Past Surgical History:  Procedure Laterality Date  . KNEE ARTHROSCOPY     right knee for torn meniscus, Dr. Luiz Blare  . SHOULDER SURGERY     Left shoulder or chonic dislocations    OB History   No obstetric history on file.      Home Medications    Prior to Admission medications   Medication Sig Start Date End Date Taking? Authorizing Provider  fluticasone (FLONASE) 50 MCG/ACT nasal spray Place 2 sprays into both nostrils daily. 04/18/16   Lurene Shadow, PA-C    Family History Family History  Problem Relation Age of Onset  . Breast cancer Mother 47       x 2  . Prostate cancer Maternal Uncle   . Stroke Paternal Grandmother   . Alzheimer's disease Paternal Grandmother     Social History Social History   Tobacco Use  . Smoking status: Never Smoker  . Smokeless tobacco: Never Used  Substance Use Topics  . Alcohol use: Yes    Alcohol/week: 2.0 standard drinks    Types: 2 Standard drinks or equivalent per week  . Drug use: No     Allergies   Penicillins   Review of Systems Review of Systems No sore throat No cough No pleuritic pain No wheezing No nasal congestion No post-nasal drainage No sinus pain/pressure No itchy/red eyes No earache No hemoptysis No SOB No fever/chills No nausea No vomiting No  abdominal pain No diarrhea No urinary symptoms No skin rash No fatigue No myalgias No headache   Physical Exam Triage Vital Signs ED Triage Vitals  Enc Vitals Group     BP 02/20/19 0943 125/84     Pulse Rate 02/20/19 0943 78     Resp --      Temp 02/20/19 0943 98.7 F (37.1 C)     Temp Source 02/20/19 0943 Oral     SpO2 02/20/19 0943 99 %     Weight 02/20/19 0944 240 lb (108.9 kg)     Height 02/20/19 0944 5\' 8"  (1.727 m)     Head Circumference --      Peak Flow --      Pain Score 02/20/19 0944 0     Pain Loc --      Pain Edu? --      Excl. in GC? --    No data found.  Updated Vital Signs BP 125/84 (BP Location: Right Arm)   Pulse 78   Temp 98.7 F (37.1 C) (Oral)   Ht 5\' 8"  (1.727 m)   Wt 108.9 kg   SpO2 99%   BMI 36.49 kg/m   Visual Acuity Right Eye Distance:   Left Eye Distance:   Bilateral Distance:    Right Eye Near:  Left Eye Near:    Bilateral Near:     Physical Exam Vitals and nursing note reviewed.  Constitutional:      General: She is not in acute distress. Neurological:     Mental Status: She is alert.   Patient not examined otherwise   UC Treatments / Results  Labs (all labs ordered are listed, but only abnormal results are displayed) Labs Reviewed  NOVEL CORONAVIRUS, NAA    EKG   Radiology No results found.  Procedures Procedures (including critical care time)  Medications Ordered in UC Medications - No data to display  Initial Impression / Assessment and Plan / UC Course  I have reviewed the triage vital signs and the nursing notes.  Pertinent labs & imaging results that were available during my care of the patient were reviewed by me and considered in my medical decision making (see chart for details).    Patient assymptomatic at present. COVID19 send out   Final Clinical Impressions(s) / UC Diagnoses   Final diagnoses:  Close exposure to COVID-19 virus     Discharge Instructions     Isolate yourself until  COVID-19 test result is available.   If your COVID19 test is positive, then you are infected with the novel coronavirus and could give the virus to others.  Please continue isolation at home for at least 10 days since the start of your symptoms. If you do not have symptoms, please isolate at home for 10 days from the day you were tested. Once you complete your 10 day quarantine, you may return to normal activities as long as you've not had a fever for over 24 hours (without taking fever reducing medicine) and your symptoms are improving. Please continue good preventive care measures, including:  frequent hand-washing, avoid touching your face, cover coughs/sneezes, stay out of crowds and keep a 6 foot distance from others.  Go to the nearest hospital emergency room if fever/cough/breathlessness are severe or illness seems like a threat to life.     ED Prescriptions    None        Kandra Nicolas, MD 02/20/19 1037

## 2019-02-20 NOTE — Discharge Instructions (Addendum)
Isolate yourself until COVID-19 test result is available.   If your COVID19 test is positive, then you are infected with the novel coronavirus and could give the virus to others.  Please continue isolation at home for at least 10 days since the start of your symptoms. If you do not have symptoms, please isolate at home for 10 days from the day you were tested. Once you complete your 10 day quarantine, you may return to normal activities as long as you've not had a fever for over 24 hours (without taking fever reducing medicine) and your symptoms are improving. Please continue good preventive care measures, including:  frequent hand-washing, avoid touching your face, cover coughs/sneezes, stay out of crowds and keep a 6 foot distance from others.  Go to the nearest hospital emergency room if fever/cough/breathlessness are severe or illness seems like a threat to life.  

## 2019-02-22 LAB — NOVEL CORONAVIRUS, NAA: SARS-CoV-2, NAA: NOT DETECTED

## 2019-11-02 ENCOUNTER — Encounter: Payer: Self-pay | Admitting: Emergency Medicine

## 2019-11-02 ENCOUNTER — Other Ambulatory Visit: Payer: Self-pay

## 2019-11-02 ENCOUNTER — Emergency Department (INDEPENDENT_AMBULATORY_CARE_PROVIDER_SITE_OTHER)
Admission: EM | Admit: 2019-11-02 | Discharge: 2019-11-02 | Disposition: A | Discharge status: Home / Self Care | Payer: No Typology Code available for payment source | Source: Home / Self Care

## 2019-11-02 DIAGNOSIS — M7021 Olecranon bursitis, right elbow: Secondary | ICD-10-CM | POA: Diagnosis not present

## 2019-11-02 MED ORDER — CEPHALEXIN 500 MG PO CAPS: 500.0000 mg | ORAL_CAPSULE | Freq: Two times a day (BID) | ORAL | 0 refills | Status: DC

## 2019-11-02 NOTE — ED Provider Notes (Signed)
[facility]    CSN: [id number] Arrival date & time: 11/02/19  0037      History   Chief Complaint Chief Complaint  Patient presents with  . Elbow Pain    HPI Tracy Ford is a 44 y.o. female.   HPI  Tracy Ford is a 44 y.o. female presenting to UC with c/o 4 days of gradually worsening Right elbow pain, redness, warmth and swelling.  No known injury. Pain is mildly achy, worse when touched. Denies fever, chills, n/v/d. No hx of similar symptoms.    History reviewed. No pertinent past medical history.  Patient Active Problem List   Diagnosis Date Noted  . Right knee pain 12/11/2013  . Family history of breast cancer in first degree relative 02/24/2013  . HIP PAIN, LEFT 06/30/2009    Past Surgical History:  Procedure Laterality Date  . KNEE ARTHROSCOPY     right knee for torn meniscus, Dr. Luiz Blare  . SHOULDER SURGERY     Left shoulder or chonic dislocations    OB History   No obstetric history on file.      Home Medications    Prior to Admission medications   Medication Sig Start Date End Date Taking? Authorizing Provider  cetirizine (ZYRTEC) 10 MG chewable tablet Chew 10 mg by mouth daily.   Yes [provider]  ibuprofen (ADVIL) 200 MG tablet Take 200 mg by mouth every 6 (six) hours as needed.   Yes [provider]  cephALEXin (KEFLEX) 500 MG capsule Take 1 capsule (500 mg total) by mouth 2 (two) times daily. 11/02/19   Lurene Shadow, PA-C  fluticasone (FLONASE) 50 MCG/ACT nasal spray Place 2 sprays into both nostrils daily. 04/18/16   Lurene Shadow, PA-C    Family History Family History  Problem Relation Age of Onset  . Breast cancer Mother 47       x 2  . Prostate cancer Maternal Uncle   . Stroke Paternal Grandmother   . Alzheimer's disease Paternal Grandmother     Social History Social History   Tobacco Use  . Smoking status: Never Smoker  . Smokeless tobacco: Never Used  Vaping Use  . Vaping Use:  Never used  Substance Use Topics  . Alcohol use: Yes    Alcohol/week: 2.0 standard drinks    Types: 2 Standard drinks or equivalent per week  . Drug use: No     Allergies   Penicillins   Review of Systems Review of Systems  Constitutional: Negative for chills and fever.  Musculoskeletal: Positive for arthralgias and joint swelling.  Skin: Positive for color change. Negative for wound.     Physical Exam Triage Vital Signs ED Triage Vitals  Enc Vitals Group     BP 11/02/19 0830 (!) 124/91     Pulse Rate 11/02/19 0830 71     Resp --      Temp 11/02/19 0830 98.2 F (36.8 C)     Temp Source 11/02/19 0830 Oral     SpO2 11/02/19 0830 100 %     Weight 11/02/19 0831 238 lb (108 kg)     Height 11/02/19 0831 5\' 8"  (1.727 m)     Head Circumference --      Peak Flow --      Pain Score 11/02/19 0831 7     Pain Loc --      Pain Edu? --      Excl. in GC? --    No data found.  Updated Vital Signs BP (!) 124/91 (BP Location: Right Arm)   Pulse 71   Temp 98.2 F (36.8 C) (Oral)   Ht 5\' 8"  (1.727 m)   Wt 238 lb (108 kg)   SpO2 100%   BMI 36.19 kg/m   Visual Acuity Right Eye Distance:   Left Eye Distance:   Bilateral Distance:    Right Eye Near:   Left Eye Near:    Bilateral Near:     Physical Exam Vitals and nursing note reviewed.  Constitutional:      Appearance: Normal appearance. She is well-developed.  HENT:     Head: Normocephalic and atraumatic.  Cardiovascular:     Rate and Rhythm: Normal rate and regular rhythm.     Pulses:          Radial pulses are 2+ on the right side.  Pulmonary:     Effort: Pulmonary effort is normal. No respiratory distress.  Musculoskeletal:        General: Swelling and tenderness present. Normal range of motion.     Cervical back: Normal range of motion.     Comments: Right elbow: mild swelling over olecranon process, tender. Full ROM.   Skin:    General: Skin is warm and dry.     Findings: Erythema present.     Comments:  Right elbow: erythema, warmth over olecranon. Tenderness.   Neurological:     Mental Status: She is alert and oriented to person, place, and time.  Psychiatric:        Behavior: Behavior normal.      UC Treatments / Results  Labs (all labs ordered are listed, but only abnormal results are displayed) Labs Reviewed - No data to display  EKG   Radiology No results found.  Procedures Procedures (including critical care time)  Medications Ordered in UC Medications - No data to display  Initial Impression / Assessment and Plan / UC Course  I have reviewed the triage vital signs and the nursing notes.  Pertinent labs & imaging results that were available during my care of the patient were reviewed by me and considered in my medical decision making (see chart for details).     Hx and exam c/w early cellulitis over olecranon without evidence of septic joint. Will start on keflex Elbow sleeve offered, pt would like to try ace wrap instead F/u in 3-4 days if not improving, sooner if worsening. AVS given   Final Clinical Impressions(s) / UC Diagnoses   Final diagnoses:  Olecranon bursitis of right elbow     Discharge Instructions      Please take antibiotics as prescribed and be sure to complete entire course even if you start to feel better to ensure infection does not come back.  Follow up in 3-4 days if not improving, sooner if significantly worsening.     ED Prescriptions    Medication Sig Dispense Auth. Provider   cephALEXin (KEFLEX) 500 MG capsule Take 1 capsule (500 mg total) by mouth 2 (two) times daily. 14 capsule , Lurene Shadow     PDMP not reviewed this encounter.   [place], Lurene Shadow 11/02/19 [phone]

## 2019-11-02 NOTE — ED Triage Notes (Signed)
Rt elbow pain x 4 days, red, hot, swollen, painful.Denies injury, took ibuprofen Vaccinated

## 2019-11-02 NOTE — Discharge Instructions (Signed)
  Please take antibiotics as prescribed and be sure to complete entire course even if you start to feel better to ensure infection does not come back.  Follow up in 3-4 days if not improving, sooner if significantly worsening.

## 2020-02-14 ENCOUNTER — Encounter: Payer: Self-pay | Admitting: Emergency Medicine

## 2020-02-14 ENCOUNTER — Emergency Department (INDEPENDENT_AMBULATORY_CARE_PROVIDER_SITE_OTHER): Payer: No Typology Code available for payment source

## 2020-02-14 ENCOUNTER — Other Ambulatory Visit: Payer: Self-pay

## 2020-02-14 ENCOUNTER — Emergency Department (INDEPENDENT_AMBULATORY_CARE_PROVIDER_SITE_OTHER)
Admission: EM | Admit: 2020-02-14 | Discharge: 2020-02-14 | Disposition: A | Discharge status: Home / Self Care | Payer: No Typology Code available for payment source | Source: Home / Self Care

## 2020-02-14 DIAGNOSIS — G501 Atypical facial pain: Secondary | ICD-10-CM | POA: Diagnosis not present

## 2020-02-14 DIAGNOSIS — R519 Headache, unspecified: Secondary | ICD-10-CM

## 2020-02-14 DIAGNOSIS — H9202 Otalgia, left ear: Secondary | ICD-10-CM | POA: Diagnosis not present

## 2020-02-14 DIAGNOSIS — R6884 Jaw pain: Secondary | ICD-10-CM

## 2020-02-14 MED ORDER — VALACYCLOVIR HCL 1 G PO TABS: 1000.0000 mg | ORAL_TABLET | Freq: Three times a day (TID) | ORAL | 0 refills | Status: AC

## 2020-02-14 MED ORDER — ACETAMINOPHEN 500 MG PO TABS
1000.0000 mg | ORAL_TABLET | Freq: Once | ORAL | Status: AC
  Administered 2020-02-14: 14:00:00 1000 mg via ORAL

## 2020-02-14 MED ORDER — PREDNISONE 20 MG PO TABS: ORAL_TABLET | ORAL | 0 refills | Status: DC

## 2020-02-14 NOTE — ED Triage Notes (Signed)
Patient diagnosed with a left ear infection on Wednesday, given Clindamycin, still taking Mucinex, no fever.  Patient states she can feel the fluid in her ear, also a headache.  Patient is fully vaccinated.

## 2020-02-14 NOTE — Discharge Instructions (Addendum)
  You may take 500mg  acetaminophen every 4-6 hours or in combination with ibuprofen 400-600mg  every 6-8 hours as needed for pain, inflammation, and fever.  Be sure to well hydrated with clear liquids and get at least 8 hours of sleep at night, preferably more while sick.   Please follow up with family medicine in 1 week if needed.  If you develop a rash or blisters on the side of your face, be sure to start the valtrex as soon as possible for suspected shingles.

## 2020-02-14 NOTE — ED Provider Notes (Signed)
[facility]    CSN: [id number] Arrival date & time: 02/14/20  1153      History   Chief Complaint Chief Complaint  Patient presents with  . Otalgia    HPI Tracy Ford is a 44 y.o. female.   HPI  Tracy Ford is a 44 y.o. female presenting to UC with c/o Left ear pain for 1 week, started with mild nasal congestion. She called [facility] last weekend, was instructed to take mucinex but had not relief. She called back Wednesday and was prescribed clindamycin but states she still has severe pain in Left ear and now feels like fluid is in her ear. Pain is aching and sore, 7/10. No relief with tylenol and ibuprofen taken yesterday. No pain medication taken today.  Pt states it "is unbearable" at times.  Denies HA or dizziness. No tooth pain but pain in ear/jaw is worse with chewing at times. No fever, chills, n/v/d. No rash or bruising.   History reviewed. No pertinent past medical history.  Patient Active Problem List   Diagnosis Date Noted  . Right knee pain 12/11/2013  . Family history of breast cancer in first degree relative 02/24/2013  . HIP PAIN, LEFT 06/30/2009    Past Surgical History:  Procedure Laterality Date  . KNEE ARTHROSCOPY     right knee for torn meniscus, Dr. Luiz Blare  . SHOULDER SURGERY     Left shoulder or chonic dislocations    OB History   No obstetric history on file.      Home Medications    Prior to Admission medications   Medication Sig Start Date End Date Taking? Authorizing Provider  cetirizine (ZYRTEC) 10 MG chewable tablet Chew 10 mg by mouth daily.   Yes [provider]  fluticasone (FLONASE) 50 MCG/ACT nasal spray Place 2 sprays into both nostrils daily. 04/18/16  Yes Sabiha Sura O, PA-C  ibuprofen (ADVIL) 200 MG tablet Take 200 mg by mouth every 6 (six) hours as needed.   Yes [provider]  cephALEXin (KEFLEX) 500 MG capsule Take 1 capsule (500 mg total) by mouth 2 (two) times daily. 11/02/19    Lurene Shadow, PA-C  predniSONE (DELTASONE) 20 MG tablet 3 tabs po day one, then 2 po daily x 4 days 02/14/20   Lurene Shadow, PA-C  valACYclovir (VALTREX) 1000 MG tablet Take 1 tablet (1,000 mg total) by mouth 3 (three) times daily for 7 days. 02/14/20 02/21/20  Lurene Shadow, PA-C    Family History Family History  Problem Relation Age of Onset  . Breast cancer Mother 47       x 2  . Prostate cancer Maternal Uncle   . Stroke Paternal Grandmother   . Alzheimer's disease Paternal Grandmother     Social History Social History   Tobacco Use  . Smoking status: Never Smoker  . Smokeless tobacco: Never Used  Vaping Use  . Vaping Use: Never used  Substance Use Topics  . Alcohol use: Yes    Alcohol/week: 2.0 standard drinks    Types: 2 Standard drinks or equivalent per week  . Drug use: No     Allergies   Penicillins   Review of Systems Review of Systems  Constitutional: Negative for chills and fever.  HENT: Positive for ear discharge (clear) and ear pain (Left). Negative for congestion, facial swelling, sore throat, trouble swallowing and voice change.   Respiratory: Negative for cough and shortness of breath.   Cardiovascular: Negative for chest pain  and palpitations.  Gastrointestinal: Negative for abdominal pain, diarrhea, nausea and vomiting.  Musculoskeletal: Negative for arthralgias, back pain, myalgias, neck pain and neck stiffness.  Skin: Negative for rash.  Neurological: Negative for dizziness, light-headedness and headaches.  All other systems reviewed and are negative.    Physical Exam Triage Vital Signs ED Triage Vitals  Enc Vitals Group     BP 02/14/20 1316 134/87     Pulse Rate 02/14/20 1316 79     Resp 02/14/20 1316 16     Temp 02/14/20 1316 98.5 F (36.9 C)     Temp Source 02/14/20 1316 Oral     SpO2 02/14/20 1316 99 %     Weight 02/14/20 1317 240 lb (108.9 kg)     Height 02/14/20 1317 5\' 8"  (1.727 m)     Head Circumference --      Peak Flow  --      Pain Score 02/14/20 1317 7     Pain Loc --      Pain Edu? --      Excl. in GC? --    No data found.  Updated Vital Signs BP (!) 156/98 (BP Location: Right Arm)   Pulse 75   Temp 98.5 F (36.9 C) (Oral)   Resp 18   Ht 5\' 8"  (1.727 m)   Wt 240 lb (108.9 kg)   LMP 02/11/2020   SpO2 97%   BMI 36.49 kg/m   Visual Acuity Right Eye Distance:   Left Eye Distance:   Bilateral Distance:    Right Eye Near:   Left Eye Near:    Bilateral Near:     Physical Exam Vitals and nursing note reviewed.  Constitutional:      General: She is not in acute distress.    Appearance: Normal appearance. She is well-developed and well-nourished. She is not ill-appearing, toxic-appearing or diaphoretic.  HENT:     Head: Normocephalic and atraumatic.     Jaw: Tenderness present. No trismus, swelling, pain on movement or malocclusion.      Right Ear: Tympanic membrane and ear canal normal.     Left Ear: Tympanic membrane and ear canal normal. Tenderness ( severe tenderness with otoscope exam and during tympanometry ) present. No drainage or swelling.  No middle ear effusion. There is no impacted cerumen. No mastoid tenderness. Tympanic membrane is not erythematous or bulging.     Nose:     Right Sinus: No maxillary sinus tenderness or frontal sinus tenderness.     Left Sinus: Maxillary sinus tenderness present. No frontal sinus tenderness.     Mouth/Throat:     Lips: Pink.     Mouth: Mucous membranes are moist.     Pharynx: Oropharynx is clear. Uvula midline. No pharyngeal swelling, oropharyngeal exudate, posterior oropharyngeal erythema or uvula swelling.  Eyes:     Extraocular Movements: EOM normal.  Cardiovascular:     Rate and Rhythm: Normal rate.  Pulmonary:     Effort: Pulmonary effort is normal.  Musculoskeletal:        General: Normal range of motion.     Cervical back: Normal range of motion.  Skin:    General: Skin is warm and dry.  Neurological:     Mental Status: She is  alert and oriented to person, place, and time.  Psychiatric:        Mood and Affect: Mood and affect normal.        Behavior: Behavior normal.      UC  Treatments / Results  Labs (all labs ordered are listed, but only abnormal results are displayed) Labs Reviewed - No data to display  EKG   Radiology CT Maxillofacial Wo Contrast  Result Date: 02/14/2020 CLINICAL DATA:  Severe left facial, jaw, and ear pain EXAM: CT MAXILLOFACIAL WITHOUT CONTRAST TECHNIQUE: Multidetector CT imaging of the maxillofacial structures was performed. Multiplanar CT image reconstructions were also generated. COMPARISON:  None. FINDINGS: Osseous: No acute maxillofacial bone fracture. Bony orbital walls are intact. Mandible intact without fracture. Bilateral temporomandibular joints are normal in appearance without significant arthropathy or malalignment. No bony erosion or periostitis. No significant periodontal disease. Orbits: Negative. No traumatic or inflammatory finding. Sinuses: Paranasal sinuses and mastoid air cells are clear. Soft tissues: No soft tissue edema or fluid collection. Internal and external auditory canals are within normal limits. Limited intracranial: No significant or unexpected finding. IMPRESSION: Unremarkable study. No findings to explain patient's left-sided maxillofacial pain. Electronically Signed   By: Duanne Guess D.O.   On: 02/14/2020 15:03    Procedures Procedures (including critical care time)  Medications Ordered in UC Medications  acetaminophen (TYLENOL) tablet 1,000 mg (1,000 mg Oral Given 02/14/20 1416)    Initial Impression / Assessment and Plan / UC Course  I have reviewed the triage vital signs and the nursing notes.  Pertinent labs & imaging results that were available during my care of the patient were reviewed by me and considered in my medical decision making (see chart for details).    Tympanometry: Left ear: Normal, Right ear: noisy Severe tenderness  noted on exam of Left ear and palpation around anterior ear and face. No induration or crepitus Concern for possible sinus clot vs internal abscess given severity of pain. CT maxillofacial: no acute findings Ddx: trigeminal neuralgia, early herpes zoster, TMJ Encouraged f/u with PCP this week for recheck of symptoms AVS given  Final Clinical Impressions(s) / UC Diagnoses   Final diagnoses:  Acute otalgia, left  Left facial pain     Discharge Instructions      You may take 500mg  acetaminophen every 4-6 hours or in combination with ibuprofen 400-600mg  every 6-8 hours as needed for pain, inflammation, and fever.  Be sure to well hydrated with clear liquids and get at least 8 hours of sleep at night, preferably more while sick.   Please follow up with family medicine in 1 week if needed.  If you develop a rash or blisters on the side of your face, be sure to start the valtrex as soon as possible for suspected shingles.      ED Prescriptions    Medication Sig Dispense Auth. Provider   predniSONE (DELTASONE) 20 MG tablet 3 tabs po day one, then 2 po daily x 4 days 11 tablet Lourdes Kucharski O, PA-C   valACYclovir (VALTREX) 1000 MG tablet Take 1 tablet (1,000 mg total) by mouth 3 (three) times daily for 7 days. 21 tablet 10[birth year]-1986, Lurene Shadow     PDMP not reviewed this encounter.   [place], Lurene Shadow 02/14/20 1527

## 2020-08-05 ENCOUNTER — Emergency Department (INDEPENDENT_AMBULATORY_CARE_PROVIDER_SITE_OTHER)
Admission: RE | Admit: 2020-08-05 | Discharge: 2020-08-05 | Disposition: A | Discharge status: Home / Self Care | Payer: No Typology Code available for payment source | Source: Ambulatory Visit | Attending: Family Medicine | Admitting: Family Medicine

## 2020-08-05 ENCOUNTER — Other Ambulatory Visit: Payer: Self-pay

## 2020-08-05 VITALS — BP 152/99 | HR 81 | Temp 99.1°F | Resp 18 | Ht 68.0 in | Wt 240.0 lb

## 2020-08-05 DIAGNOSIS — J069 Acute upper respiratory infection, unspecified: Secondary | ICD-10-CM

## 2020-08-05 DIAGNOSIS — Z20822 Contact with and (suspected) exposure to covid-19: Secondary | ICD-10-CM

## 2020-08-05 NOTE — ED Triage Notes (Signed)
Pt st her daughter is positive for Covid since Sunday. Pt presents with couch and congestion since Tuesday. Pt st her at home test was negative.  Pt st she has taken ibuprofen to help with the pain and a musinex.

## 2020-08-05 NOTE — Discharge Instructions (Addendum)
Take plain guaifenesin (1200mg extended release tabs such as Mucinex) twice daily, with plenty of water, for cough and congestion.  May add Pseudoephedrine (30mg, one or two every 4 to 6 hours) for sinus congestion.  Get adequate rest.   May use Afrin nasal spray (or generic oxymetazoline) each morning for about 5 days and then discontinue.  Also recommend using saline nasal spray several times daily and saline nasal irrigation (AYR is a common brand).  Use Flonase nasal spray each morning after using Afrin nasal spray and saline nasal irrigation. Try warm salt water gargles for sore throat.  Stop all antihistamines for now, and other non-prescription cough/cold preparations. May take Ibuprofen 200mg, 4 tabs every 8 hours with food for headache, body aches, etc. May take Delsym Cough Suppressant ("12 Hour Cough Relief") at bedtime for nighttime cough.   If your COVID-19 test is positive, isolate yourself for five days from today.  At the end of five days you may end isolation if your symptoms have cleared or improved, and you have not had a fever for 24 hours. At this time you should wear a mask for five more days when you are around others.         

## 2020-08-05 NOTE — ED Provider Notes (Signed)
[facility]    CSN: [id number] Arrival date & time: 08/05/20  0845      History   Chief Complaint Chief Complaint  Patient presents with  . Cough  . Headache  . appoitment    HPI Tracy Ford is a 45 y.o. female.   Five days ago patient' daughter tested positive for COVID19 after developing URI symptoms. Three days ago patient developed scratchy throat, tightness in her anterior chest, fatigue and headache.  Yesterday she developed myalgias, cough, and sinus congestion.  She has had loose stools, now resolved.  She denies fevers, chills, and sweats and pleuritic pain.  She has had mild shortness of breath with activity. Patient had a negative home COVID19 test, and negative PCR test three days ago.  The history is provided by the patient.    History reviewed. No pertinent past medical history.  Patient Active Problem List   Diagnosis Date Noted  . Right knee pain 12/11/2013  . Family history of breast cancer in first degree relative 02/24/2013  . HIP PAIN, LEFT 06/30/2009    Past Surgical History:  Procedure Laterality Date  . KNEE ARTHROSCOPY     right knee for torn meniscus, Dr. Luiz Blare  . SHOULDER SURGERY     Left shoulder or chonic dislocations    OB History   No obstetric history on file.      Home Medications    Prior to Admission medications   Medication Sig Start Date End Date Taking? Authorizing Provider  cephALEXin (KEFLEX) 500 MG capsule Take 1 capsule (500 mg total) by mouth 2 (two) times daily. 11/02/19   Lurene Shadow, PA-C  cetirizine (ZYRTEC) 10 MG chewable tablet Chew 10 mg by mouth daily.    [provider]  fluticasone (FLONASE) 50 MCG/ACT nasal spray Place 2 sprays into both nostrils daily. 04/18/16   Lurene Shadow, PA-C  ibuprofen (ADVIL) 200 MG tablet Take 200 mg by mouth every 6 (six) hours as needed.    [provider]  predniSONE (DELTASONE) 20 MG tablet 3 tabs po day one, then 2 po daily x 4 days  02/14/20   Lurene Shadow, PA-C    Family History Family History  Problem Relation Age of Onset  . Breast cancer Mother 47       x 2  . Prostate cancer Maternal Uncle   . Stroke Paternal Grandmother   . Alzheimer's disease Paternal Grandmother   . Prostate cancer Father     Social History Social History   Tobacco Use  . Smoking status: Never Smoker  . Smokeless tobacco: Never Used  Vaping Use  . Vaping Use: Never used  Substance Use Topics  . Alcohol use: Yes    Alcohol/week: 2.0 standard drinks    Types: 2 Standard drinks or equivalent per week    Comment: occ  . Drug use: No     Allergies   Penicillins   Review of Systems Review of Systems  + sore throat + cough No pleuritic pain No wheezing + nasal congestion + post-nasal drainage No sinus pain/pressure No itchy/red eyes ? earache No hemoptysis ? SOB No fever/chills No nausea No vomiting No abdominal pain + diarrhea, resolved No urinary symptoms No skin rash + fatigue + myalgias + headache Used OTC meds (ibuprofen and Mucinex Max) without relief    Physical Exam Triage Vital Signs ED Triage Vitals  Enc Vitals Group     BP 08/05/20 0857 (!) 152/99  Pulse Rate 08/05/20 0857 81     Resp 08/05/20 0857 18     Temp 08/05/20 0857 99.1 F (37.3 C)     Temp Source 08/05/20 0857 Oral     SpO2 08/05/20 0857 96 %     Weight 08/05/20 0854 240 lb (108.9 kg)     Height 08/05/20 0854 5\' 8"  (1.727 m)     Head Circumference --      Peak Flow --      Pain Score --      Pain Loc --      Pain Edu? --      Excl. in GC? --    No data found.  Updated Vital Signs BP (!) 152/99 (BP Location: Right Arm)   Pulse 81   Temp 99.1 F (37.3 C) (Oral)   Resp 18   Ht 5\' 8"  (1.727 m)   Wt 108.9 kg   LMP 07/31/2020 (Exact Date)   SpO2 96%   BMI 36.49 kg/m   Visual Acuity Right Eye Distance:   Left Eye Distance:   Bilateral Distance:    Right Eye Near:   Left Eye Near:    Bilateral Near:      Physical Exam Nursing notes and Vital Signs reviewed. Appearance:  Patient appears stated age, and in no acute distress Eyes:  Pupils are equal, round, and reactive to light and accomodation.  Extraocular movement is intact.  Conjunctivae are not inflamed  Ears:  Canals normal.  Tympanic membranes normal.  Nose:  Mildly congested turbinates.  No sinus tenderness.  Pharynx:  Normal Neck:  Supple.  No adenopathy.  Lungs:  Clear to auscultation.  Breath sounds are equal.  Moving air well. Heart:  Regular rate and rhythm without murmurs, rubs, or gallops.  Abdomen:  Nontender without masses or hepatosplenomegaly.  Bowel sounds are present.  No CVA or flank tenderness.  Extremities:  No edema.  Skin:  No rash present.   UC Treatments / Results  Labs (all labs ordered are listed, but only abnormal results are displayed) Labs Reviewed  NOVEL CORONAVIRUS, NAA    EKG   Radiology No results found.  Procedures Procedures (including critical care time)  Medications Ordered in UC Medications - No data to display  Initial Impression / Assessment and Plan / UC Course  I have reviewed the triage vital signs and the nursing notes.  Pertinent labs & imaging results that were available during my care of the patient were reviewed by me and considered in my medical decision making (see chart for details).    Benign exam.  There is no evidence of bacterial infection today.  Treat symptomatically for now  COVID19 PCR pending. Followup with Family Doctor if not improved in one week.    Final Clinical Impressions(s) / UC Diagnoses   Final diagnoses:  Viral URI with cough  Exposure to COVID-19 virus     Discharge Instructions     Take plain guaifenesin (1200mg  extended release tabs such as Mucinex) twice daily, with plenty of water, for cough and congestion.  May add Pseudoephedrine (30mg , one or two every 4 to 6 hours) for sinus congestion.  Get adequate rest.   May use Afrin nasal  spray (or generic oxymetazoline) each morning for about 5 days and then discontinue.  Also recommend using saline nasal spray several times daily and saline nasal irrigation (AYR is a common brand).  Use Flonase nasal spray each morning after using Afrin nasal spray and saline nasal  irrigation. Try warm salt water gargles for sore throat.  Stop all antihistamines for now, and other non-prescription cough/cold preparations. May take Ibuprofen 200mg , 4 tabs every 8 hours with food for headache, body aches, etc. May take Delsym Cough Suppressant ("12 Hour Cough Relief") at bedtime for nighttime cough.  If your COVID-19 test is positive, isolate yourself for five days from today.  At the end of five days you may end isolation if your symptoms have cleared or improved, and you have not had a fever for 24 hours. At this time you should wear a mask for five more days when you are around others.            ED Prescriptions    None        , MD 08/06/20 1327

## 2020-08-06 LAB — NOVEL CORONAVIRUS, NAA: SARS-CoV-2, NAA: NOT DETECTED

## 2020-08-06 LAB — SARS-COV-2, NAA 2 DAY TAT

## 2020-08-26 ENCOUNTER — Other Ambulatory Visit: Payer: Self-pay

## 2020-08-26 ENCOUNTER — Ambulatory Visit: Payer: No Typology Code available for payment source

## 2020-08-26 ENCOUNTER — Emergency Department (INDEPENDENT_AMBULATORY_CARE_PROVIDER_SITE_OTHER)
Admission: RE | Admit: 2020-08-26 | Discharge: 2020-08-26 | Disposition: A | Discharge status: Home / Self Care | Payer: No Typology Code available for payment source | Source: Ambulatory Visit

## 2020-08-26 VITALS — BP 157/102 | HR 84 | Temp 98.2°F | Resp 17

## 2020-08-26 DIAGNOSIS — R059 Cough, unspecified: Secondary | ICD-10-CM

## 2020-08-26 DIAGNOSIS — J019 Acute sinusitis, unspecified: Secondary | ICD-10-CM

## 2020-08-26 DIAGNOSIS — J309 Allergic rhinitis, unspecified: Secondary | ICD-10-CM

## 2020-08-26 DIAGNOSIS — B9689 Other specified bacterial agents as the cause of diseases classified elsewhere: Secondary | ICD-10-CM | POA: Diagnosis not present

## 2020-08-26 MED ORDER — SULFAMETHOXAZOLE-TRIMETHOPRIM 800-160 MG PO TABS: 1.0000 | ORAL_TABLET | Freq: Two times a day (BID) | ORAL | 0 refills | Status: AC

## 2020-08-26 MED ORDER — PREDNISONE 20 MG PO TABS: ORAL_TABLET | ORAL | 0 refills | Status: DC

## 2020-08-26 MED ORDER — FEXOFENADINE HCL 180 MG PO TABS: 180.0000 mg | ORAL_TABLET | Freq: Every day | ORAL | 0 refills | Status: DC

## 2020-08-26 MED ORDER — METHYLPREDNISOLONE ACETATE 80 MG/ML IJ SUSP
80.0000 mg | Freq: Once | INTRAMUSCULAR | Status: AC
  Administered 2020-08-26: 80 mg via INTRAMUSCULAR

## 2020-08-26 NOTE — Discharge Instructions (Addendum)
Advised patient to take medication as directed with food to completion.  Instructed patient to hold Zyrtec for the next 7 to 10 days and take Allegra 180 mg for the next 5 days, then as needed.  Advised/instructed patient not to start oral prednisone until tomorrow morning, Saturday, 08/27/2020.  Encouragedpatient increase daily water intake while taking these medications.

## 2020-08-26 NOTE — ED Provider Notes (Signed)
[facility]    CSN: [id number] Arrival date & time: 08/26/20  1241      History   Chief Complaint Chief Complaint  Patient presents with   Cough    1pm APPT, & Chest congestion    HPI Tracy Ford is a 45 y.o. female.   HPI 45 year old female presents with cough and chest congestion for 5 days, reports postnasal drainage causing episodes of vomiting.  Patient reports trying Sudafed, Motrin, Tylenol, Mucinex every 12 hours, and OTC Delsym with little to no relief.  Patient was evaluated here on 08/05/2020 with viral URI with cough.  History reviewed. No pertinent past medical history.  Patient Active Problem List   Diagnosis Date Noted   Right knee pain 12/11/2013   Family history of breast cancer in first degree relative 02/24/2013   HIP PAIN, LEFT 06/30/2009    Past Surgical History:  Procedure Laterality Date   KNEE ARTHROSCOPY     right knee for torn meniscus, Dr. Luiz Blare   SHOULDER SURGERY     Left shoulder or chonic dislocations    OB History   No obstetric history on file.      Home Medications    Prior to Admission medications   Medication Sig Start Date End Date Taking? Authorizing Provider  fexofenadine (ALLEGRA ALLERGY) 180 MG tablet Take 1 tablet (180 mg total) by mouth daily for 15 days. 08/26/20 09/10/20 Yes Trevor Iha, FNP  predniSONE (DELTASONE) 20 MG tablet Take 3 tabs PO daily x 5 days. 08/26/20  Yes Trevor Iha, FNP  sulfamethoxazole-trimethoprim (BACTRIM DS) 800-160 MG tablet Take 1 tablet by mouth 2 (two) times daily for 7 days. 08/26/20 09/02/20 Yes Trevor Iha, FNP  cephALEXin (KEFLEX) 500 MG capsule Take 1 capsule (500 mg total) by mouth 2 (two) times daily. 11/02/19   Lurene Shadow, PA-C  cetirizine (ZYRTEC) 10 MG chewable tablet Chew 10 mg by mouth daily.    [provider]  fluticasone (FLONASE) 50 MCG/ACT nasal spray Place 2 sprays into both nostrils daily. 04/18/16   Lurene Shadow, PA-C  ibuprofen (ADVIL)  200 MG tablet Take 200 mg by mouth every 6 (six) hours as needed.    [provider]    Family History Family History  Problem Relation Age of Onset   Breast cancer Mother 67       x 2   Prostate cancer Maternal Uncle    Stroke Paternal Grandmother    Alzheimer's disease Paternal Grandmother    Prostate cancer Father     Social History Social History   Tobacco Use   Smoking status: Never   Smokeless tobacco: Never  Vaping Use   Vaping Use: Never used  Substance Use Topics   Alcohol use: Yes    Alcohol/week: 2.0 standard drinks    Types: 2 Standard drinks or equivalent per week    Comment: occ   Drug use: No     Allergies   Penicillins   Review of Systems Review of Systems  Constitutional: Negative.   HENT:  Positive for congestion and postnasal drip.   Eyes: Negative.   Respiratory:  Positive for cough.   Cardiovascular: Negative.   Gastrointestinal: Negative.   Genitourinary: Negative.   Musculoskeletal: Negative.   Skin: Negative.   Neurological: Negative.     Physical Exam Triage Vital Signs ED Triage Vitals  Enc Vitals Group     BP 08/26/20 1253 (!) 157/102     Pulse Rate 08/26/20 1253 84  Resp 08/26/20 1253 17     Temp 08/26/20 1253 98.2 F (36.8 C)     Temp Source 08/26/20 1253 Oral     SpO2 08/26/20 1253 97 %     Weight --      Height --      Head Circumference --      Peak Flow --      Pain Score 08/26/20 1255 0     Pain Loc --      Pain Edu? --      Excl. in GC? --    No data found.  Updated Vital Signs BP (!) 157/102 (BP Location: Right Arm)   Pulse 84   Temp 98.2 F (36.8 C) (Oral)   Resp 17   LMP 07/31/2020 (Exact Date)   SpO2 97%       Physical Exam Constitutional:      General: She is not in acute distress.    Appearance: Normal appearance. She is obese. She is ill-appearing.  HENT:     Head: Normocephalic and atraumatic.     Right Ear: Tympanic membrane, ear canal and external ear normal.     Left Ear:  Ear canal normal. Tympanic membrane is retracted. Tympanic membrane has decreased mobility.     Nose:     Comments: Turbinates are erythematous    Mouth/Throat:     Lips: Pink.     Pharynx: Oropharynx is clear. Uvula midline. No pharyngeal swelling or posterior oropharyngeal erythema.     Comments: Moderate amount of clear drainage of posterior oropharynx noted Cardiovascular:     Rate and Rhythm: Normal rate and regular rhythm.     Pulses: Normal pulses.     Heart sounds: Normal heart sounds.     Comments: Hypertensive Pulmonary:     Effort: Pulmonary effort is normal. No respiratory distress.     Breath sounds: Normal breath sounds. No wheezing, rhonchi or rales.     Comments: Frequent nonproductive cough noted on exam Musculoskeletal:        General: Normal range of motion.     Cervical back: Normal range of motion and neck supple. No tenderness.  Lymphadenopathy:     Cervical: No cervical adenopathy.  Skin:    General: Skin is warm and dry.  Neurological:     General: No focal deficit present.     Mental Status: She is alert and oriented to person, place, and time.  Psychiatric:        Mood and Affect: Mood normal.        Behavior: Behavior normal.     UC Treatments / Results  Labs (all labs ordered are listed, but only abnormal results are displayed) Labs Reviewed - No data to display  EKG   Radiology No results found.  Procedures Procedures (including critical care time)  Medications Ordered in UC Medications  methylPREDNISolone acetate (DEPO-MEDROL) injection 80 mg (has no administration in time range)    Initial Impression / Assessment and Plan / UC Course  I have reviewed the triage vital signs and the nursing notes.  Pertinent labs & imaging results that were available during my care of the patient were reviewed by me and considered in my medical decision making (see chart for details).     MDM: 1.  Acute bacterial rhinosinusitis, 2.  Cough, 3.   Allergic rhinitis.  Patient discharged home, hemodynamically stable. Final Clinical Impressions(s) / UC Diagnoses   Final diagnoses:  Acute bacterial rhinosinusitis  Cough  Allergic  rhinitis, unspecified seasonality, unspecified trigger     Discharge Instructions      Advised patient to take medication as directed with food to completion.  Instructed patient to hold Zyrtec for the next 7 to 10 days and take Allegra 180 mg for the next 5 days, then as needed.  Advised/instructed patient not to start oral prednisone until tomorrow morning, Saturday, 08/27/2020.  Encouragedpatient increase daily water intake while taking these medications.     ED Prescriptions     Medication Sig Dispense Auth. Provider   sulfamethoxazole-trimethoprim (BACTRIM DS) 800-160 MG tablet Take 1 tablet by mouth 2 (two) times daily for 7 days. 14 tablet Trevor Iha, FNP   predniSONE (DELTASONE) 20 MG tablet Take 3 tabs PO daily x 5 days. 15 tablet Trevor Iha, FNP   fexofenadine [facility] ALLERGY) 180 MG tablet Take 1 tablet (180 mg total) by mouth daily for 15 days. 15 tablet Trevor Iha, FNP      PDMP not reviewed this encounter.   Trevor Iha, FNP 08/26/20 1407

## 2020-08-26 NOTE — ED Triage Notes (Signed)
Pt here today for cough and chest congestion. Was here three weeks ago for similar sxs. Got better for 5 days and it returned Monday. Coughing up dark mucous. Post nasal drainage causing some episodes of vomiting. Some shortness of breath. Sudafed, Motrin/Tylenol, Mucinex q 12 hours, Delsym at night with no relief.

## 2021-05-14 ENCOUNTER — Emergency Department (INDEPENDENT_AMBULATORY_CARE_PROVIDER_SITE_OTHER)
Admission: RE | Admit: 2021-05-14 | Discharge: 2021-05-14 | Disposition: A | Discharge status: Home / Self Care | Payer: No Typology Code available for payment source | Source: Ambulatory Visit

## 2021-05-14 ENCOUNTER — Other Ambulatory Visit: Payer: Self-pay

## 2021-05-14 VITALS — BP 130/87 | HR 81 | Temp 98.4°F | Resp 20 | Ht 68.0 in | Wt 245.0 lb

## 2021-05-14 DIAGNOSIS — K122 Cellulitis and abscess of mouth: Secondary | ICD-10-CM

## 2021-05-14 DIAGNOSIS — J3489 Other specified disorders of nose and nasal sinuses: Secondary | ICD-10-CM

## 2021-05-14 DIAGNOSIS — J0100 Acute maxillary sinusitis, unspecified: Secondary | ICD-10-CM

## 2021-05-14 LAB — POCT RAPID STREP A (OFFICE): Rapid Strep A Screen: NEGATIVE

## 2021-05-14 MED ORDER — SULFAMETHOXAZOLE-TRIMETHOPRIM 800-160 MG PO TABS: 1.0000 | ORAL_TABLET | Freq: Two times a day (BID) | ORAL | 0 refills | Status: AC

## 2021-05-14 MED ORDER — PREDNISONE 20 MG PO TABS: ORAL_TABLET | ORAL | 0 refills | Status: DC

## 2021-05-14 MED ORDER — METHYLPREDNISOLONE SODIUM SUCC 125 MG IJ SOLR
125.0000 mg | Freq: Once | INTRAMUSCULAR | Status: AC
  Administered 2021-05-14: 125 mg via INTRAMUSCULAR

## 2021-05-14 NOTE — Discharge Instructions (Addendum)
Instructed patient to discontinue Tamiflu.  Instructed patient to take medication as directed with food to completion.  Advised patient may take prednisone with first dose of Bactrim for 5 of the next 10 days.  Encouraged patient to increase daily water intake while taking these medications. ?

## 2021-05-14 NOTE — ED Triage Notes (Signed)
Pt presents to Urgent Care with c/o sore throat, nasal congestion, intermittent bil ear pain x 4 days. Son had flu-like symptoms last week. Pt had virtual appt yesterday and was given Tamiflu--taken 3 doses. Main c/o is "excruciating sore throat."  ?

## 2021-05-14 NOTE — ED Provider Notes (Signed)
?[facility] ? ? ? ?CSN: [id number] ?Arrival date & time: 05/14/21  0950 ? ? ?  ? ?History   ?Chief Complaint ?Chief Complaint  ?Patient presents with  ? 9:00 APPT  ? Sore Throat  ? Nasal Congestion  ? Otalgia  ? ? ?HPI ?Tracy Ford is a 46 y.o. female.  ? ?HPI Pleasant 46 year old female presents with sore throat, nasal congestion and intermittent bilateral ear pain for 4 days.  Reports that her son had flulike symptoms last week.  Patient reports having a virtual visit yesterday and given Tamiflu having flu like illness and has taken 3 doses with a little to no relief. ? ?History reviewed. No pertinent past medical history. ? ?Patient Active Problem List  ? Diagnosis Date Noted  ? Right knee pain 12/11/2013  ? Family history of breast cancer in first degree relative 02/24/2013  ? HIP PAIN, LEFT 06/30/2009  ? ? ?Past Surgical History:  ?Procedure Laterality Date  ? KNEE ARTHROSCOPY    ? right knee for torn meniscus, Dr. Luiz Blare  ? SHOULDER SURGERY    ? Left shoulder or chonic dislocations  ? ? ?OB History   ?No obstetric history on file. ?  ? ? ? ?Home Medications   ? ?Prior to Admission medications   ?Medication Sig Start Date End Date Taking? Authorizing Provider  ?predniSONE (DELTASONE) 20 MG tablet Take 3 tabs PO daily x 5 days. 05/14/21  Yes Trevor Iha, FNP  ?sulfamethoxazole-trimethoprim (BACTRIM DS) 800-160 MG tablet Take 1 tablet by mouth 2 (two) times daily for 10 days. 05/14/21 05/24/21 Yes Trevor Iha, FNP  ?cetirizine (ZYRTEC) 10 MG chewable tablet Chew 10 mg by mouth daily.    [provider]  ?fluticasone (FLONASE) 50 MCG/ACT nasal spray Place 2 sprays into both nostrils daily. 04/18/16   Lurene Shadow, PA-C  ?ibuprofen (ADVIL) 200 MG tablet Take 200 mg by mouth every 6 (six) hours as needed.    [provider]  ?oseltamivir (TAMIFLU) 75 MG capsule Take 75 mg by mouth 2 (two) times daily. 05/13/21   [provider]  ? ? ?Family History ?Family History   ?Problem Relation Age of Onset  ? Breast cancer Mother 59  ?     x 2  ? Prostate cancer Maternal Uncle   ? Stroke Paternal Grandmother   ? Alzheimer's disease Paternal Grandmother   ? Prostate cancer Father   ? ? ?Social History ?Social History  ? ?Tobacco Use  ? Smoking status: Never  ? Smokeless tobacco: Never  ?Vaping Use  ? Vaping Use: Never used  ?Substance Use Topics  ? Alcohol use: Not Currently  ?  Comment: occ  ? Drug use: No  ? ? ? ?Allergies   ?Penicillins ? ? ?Review of Systems ?Review of Systems  ?HENT:  Positive for congestion, ear pain and sore throat.   ?All other systems reviewed and are negative. ? ? ?Physical Exam ?Triage Vital Signs ?ED Triage Vitals  ?Enc Vitals Group  ?   BP 05/14/21 1011 (!) 140/91  ?   Pulse Rate 05/14/21 1011 81  ?   Resp 05/14/21 1011 20  ?   Temp 05/14/21 1011 98.4 ?F (36.9 ?C)  ?   Temp Source 05/14/21 1011 Oral  ?   SpO2 05/14/21 1011 98 %  ?   Weight 05/14/21 1008 245 lb (111.1 kg)  ?   Height 05/14/21 1008 5\' 8"  (1.727 m)  ?   Head Circumference --   ?  Peak Flow --   ?   Pain Score 05/14/21 1008 8  ?   Pain Loc --   ?   Pain Edu? --   ?   Excl. in GC? --   ? ?No data found. ? ?Updated Vital Signs ?BP 130/87 (BP Location: Right Arm)   Pulse 81   Temp 98.4 ?F (36.9 ?C) (Oral)   Resp 20   Ht 5\' 8"  (1.727 m)   Wt 245 lb (111.1 kg)   LMP 04/29/2021 (Approximate)   SpO2 98%   BMI 37.25 kg/m?  ? ? ? ?Physical Exam ?Vitals reviewed.  ?Constitutional:   ?   General: She is not in acute distress. ?   Appearance: She is obese. She is not ill-appearing.  ?HENT:  ?   Head: Normocephalic and atraumatic.  ?   Right Ear: Tympanic membrane, ear canal and external ear normal.  ?   Left Ear: Tympanic membrane, ear canal and external ear normal.  ?   Mouth/Throat:  ?   Mouth: Mucous membranes are moist.  ?   Pharynx: Uvula midline. Pharyngeal swelling, posterior oropharyngeal erythema and uvula swelling present.  ?Eyes:  ?   Conjunctiva/sclera: Conjunctivae normal.  ?   Pupils:  Pupils are equal, round, and reactive to light.  ?Cardiovascular:  ?   Rate and Rhythm: Normal rate and regular rhythm.  ?   Pulses: Normal pulses.  ?   Heart sounds: Normal heart sounds.  ?Pulmonary:  ?   Effort: Pulmonary effort is normal.  ?   Breath sounds: Normal breath sounds. No wheezing, rhonchi or rales.  ?Musculoskeletal:  ?   Cervical back: Normal range of motion and neck supple.  ?Skin: ?   General: Skin is warm and dry.  ?Neurological:  ?   General: No focal deficit present.  ?   Mental Status: She is alert and oriented to person, place, and time.  ? ? ? ?UC Treatments / Results  ?Labs ?(all labs ordered are listed, but only abnormal results are displayed) ?Labs Reviewed  ?POCT RAPID STREP A (OFFICE)  ? ? ?EKG ? ? ?Radiology ?No results found. ? ?Procedures ?Procedures (including critical care time) ? ?Medications Ordered in UC ?Medications  ?methylPREDNISolone sodium succinate (SOLU-MEDROL) 125 mg/2 mL injection 125 mg (125 mg Intramuscular Given 05/14/21 1110)  ? ? ?Initial Impression / Assessment and Plan / UC Course  ?I have reviewed the triage vital signs and the nursing notes. ? ?Pertinent labs & imaging results that were available during my care of the patient were reviewed by me and considered in my medical decision making (see chart for details). ? ?  ? ?MDM: 1.  Uvulitis-Rx'd Bactrim, IM Solu-Medrol 125 mg given once in clinic prior to discharge; 2.  Acute maxillary sinusitis, recurrence not specified-Rx'd Bactrim; 3.  Sinus pressure-Rx'd Prednisone. Instructed patient to discontinue Tamiflu.  Instructed patient to take medication as directed with food to completion.  Advised patient may take prednisone with first dose of Bactrim for 5 of the next 10 days.  Encouraged patient to increase daily water intake while taking these medications.  Patient discharged home, hemodynamically stable. ?Final Clinical Impressions(s) / UC Diagnoses  ? ?Final diagnoses:  ?Uvulitis  ?Acute maxillary sinusitis,  recurrence not specified  ?Sinus pressure  ? ? ? ?Discharge Instructions   ? ?  ?Instructed patient to discontinue Tamiflu.  Instructed patient to take medication as directed with food to completion.  Advised patient may take prednisone with first dose of  Bactrim for 5 of the next 10 days.  Encouraged patient to increase daily water intake while taking these medications. ? ? ? ? ?ED Prescriptions   ? ? Medication Sig Dispense Auth. Provider  ? sulfamethoxazole-trimethoprim (BACTRIM DS) 800-160 MG tablet Take 1 tablet by mouth 2 (two) times daily for 10 days. 20 tablet Trevor Iha, FNP  ? predniSONE (DELTASONE) 20 MG tablet Take 3 tabs PO daily x 5 days. 15 tablet Trevor Iha, FNP  ? ?  ? ?PDMP not reviewed this encounter. ?  ?Trevor Iha, FNP ?05/14/21 1131 ? ?

## 2021-07-22 IMAGING — CT CT MAXILLOFACIAL W/O CM
3 of 4 series · 15 of 47 positions shown, 18 images · non-contrast
Comparison: None.

CLINICAL DATA: Severe left facial, jaw, and ear pain

EXAM:
CT MAXILLOFACIAL WITHOUT CONTRAST
TECHNIQUE: Multidetector CT imaging of the maxillofacial structures was
performed. Multiplanar CT image reconstructions were also generated.

[Series 2: max soft · axial · 0.39mm/px · z∈[+846,+982]mm · 9 of 80 slices shown, 12 images]
[im 6/80  brain]
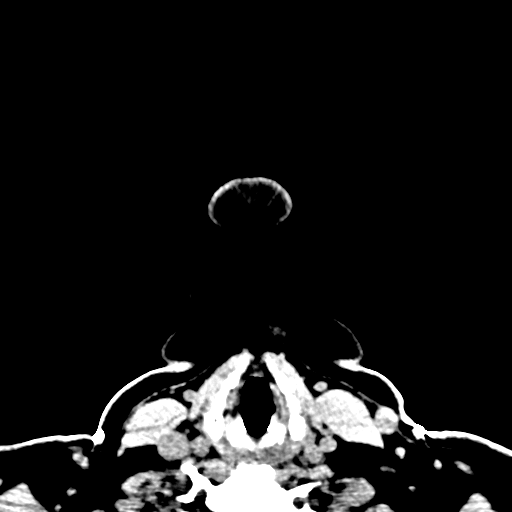
[im 6/80  bone]
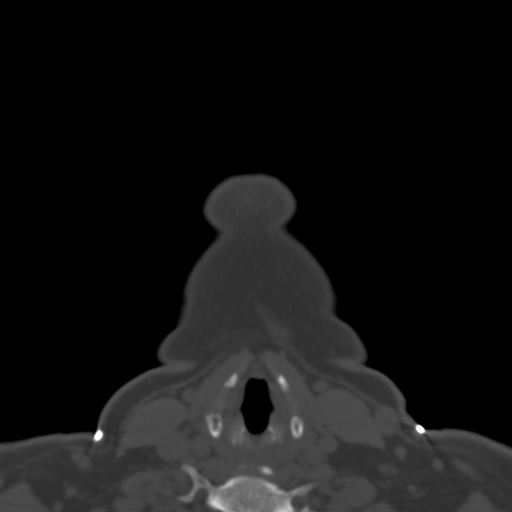
[im 17/80  bone]
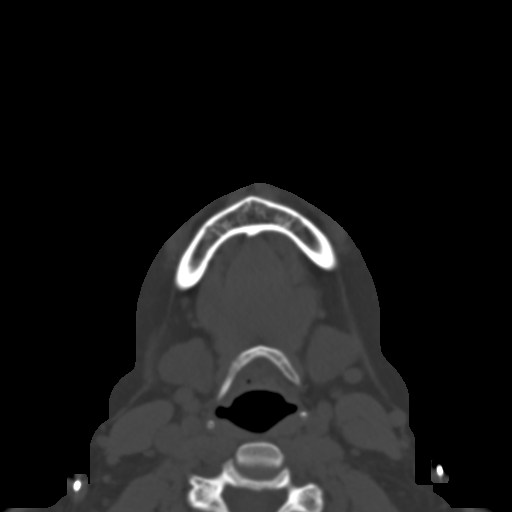
[im 23/80  bone]
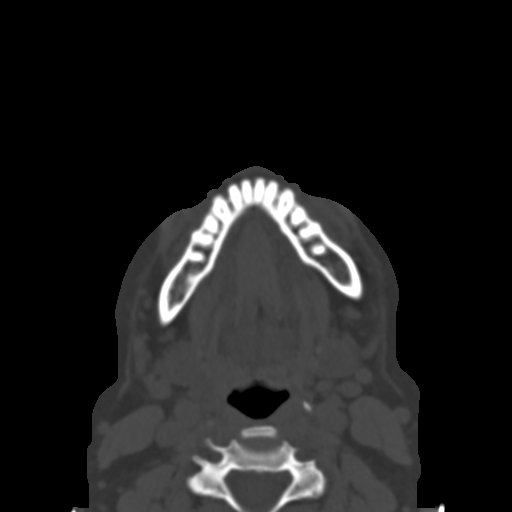
[im 34/80  bone]
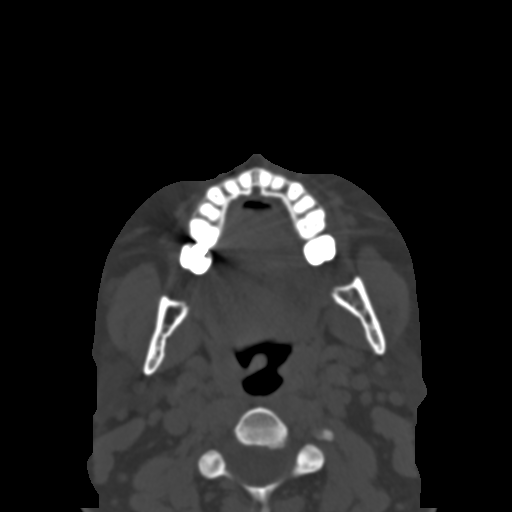
[im 40/80  brain]
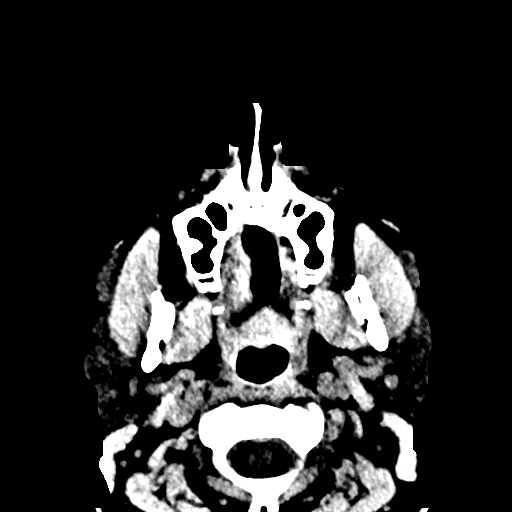
[im 40/80  bone]
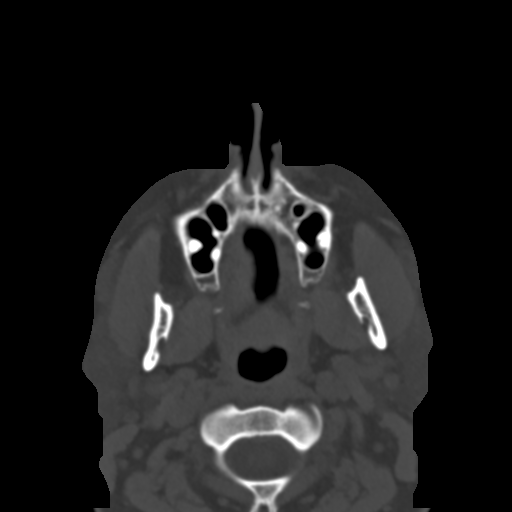
[im 46/80  bone]
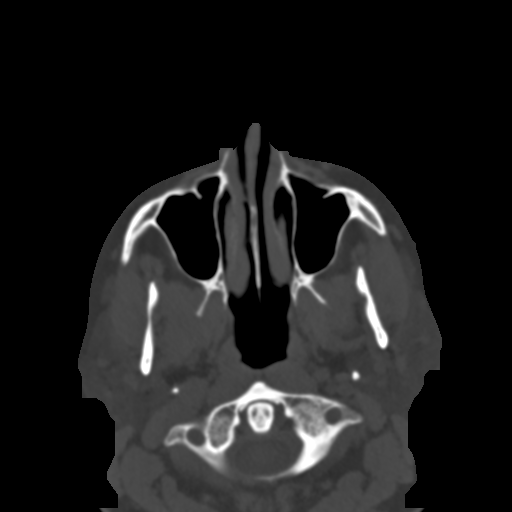
[im 57/80  bone]
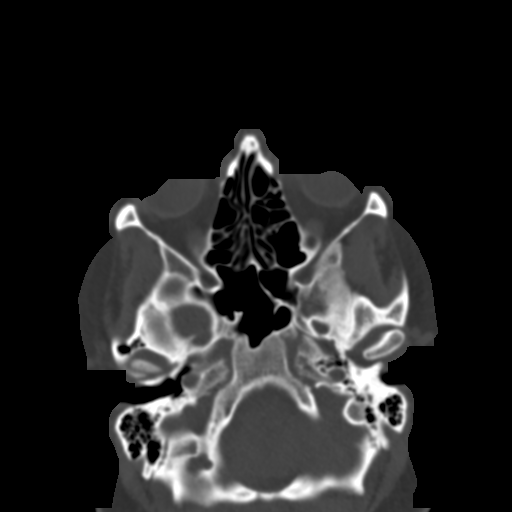
[im 63/80  bone]
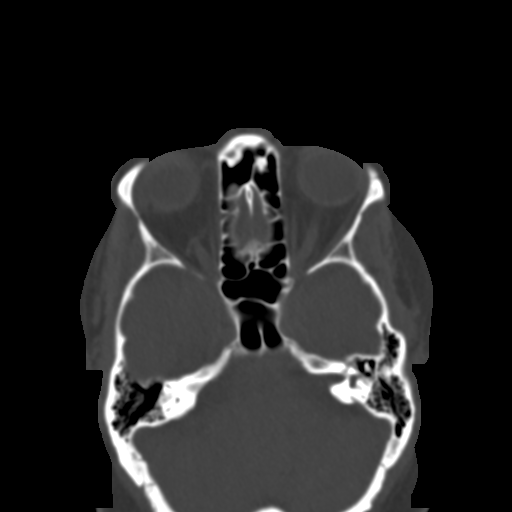
[im 74/80  brain]
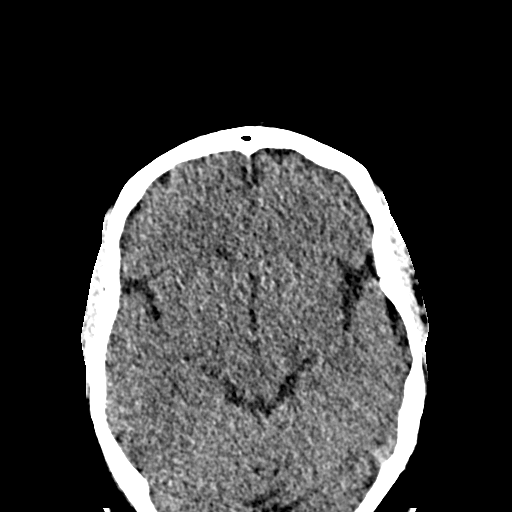
[im 74/80  bone]
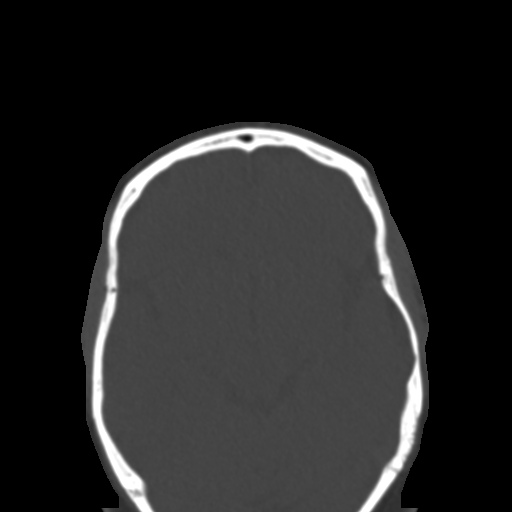

[Series 6: coronal soft · coronal · 0.37mm/px · 3 of 78 slices shown]
[im 26/78  bone]
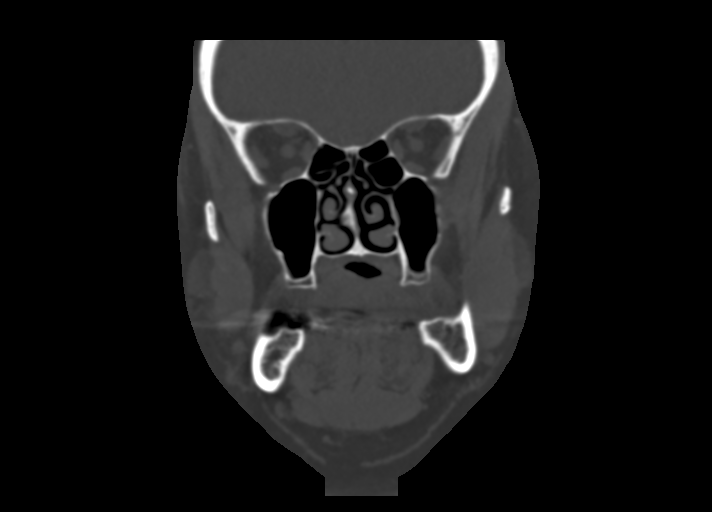
[im 35/78  bone]
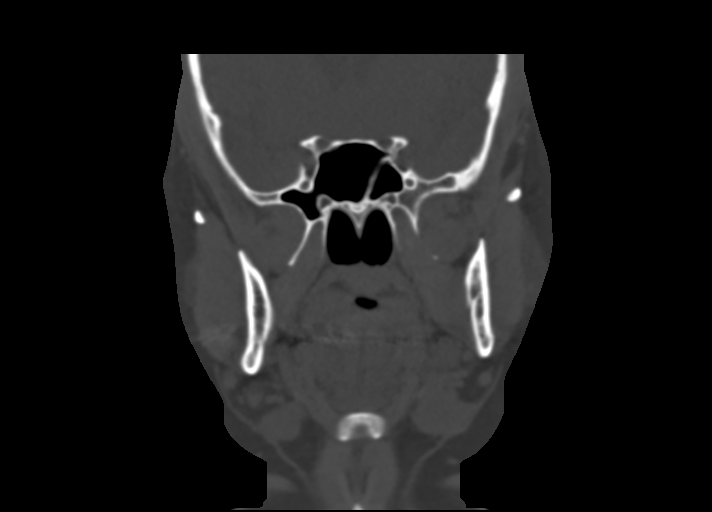
[im 43/78  bone]
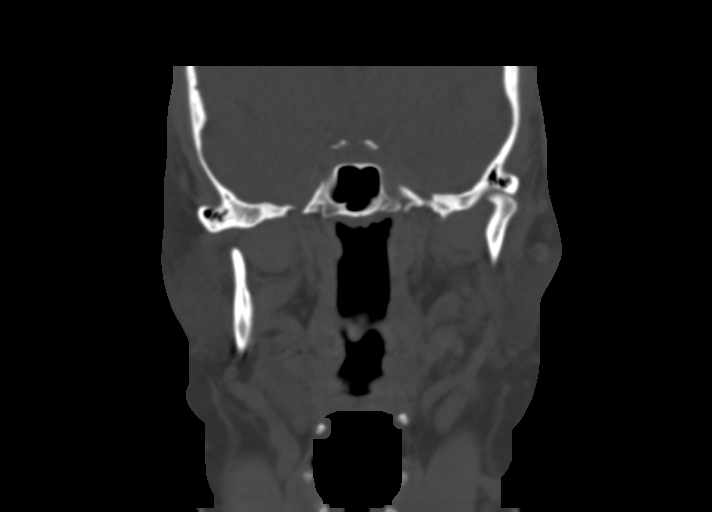

[Series 8: sagittal soft · sagittal · 0.36mm/px · 3 of 85 slices shown]
[im 29/85  bone]
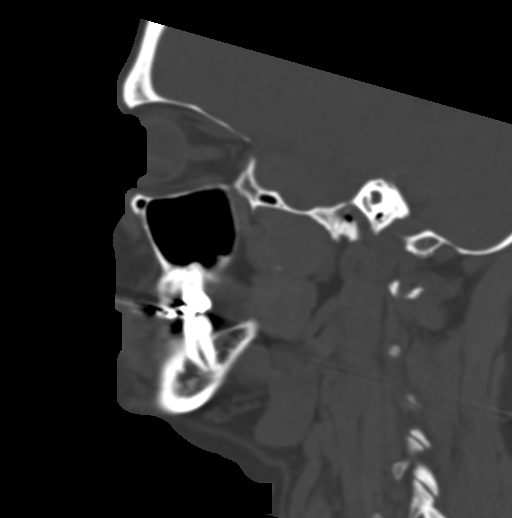
[im 43/85  bone]
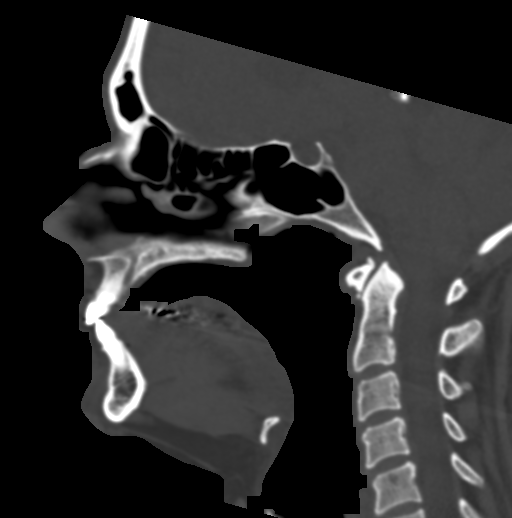
[im 57/85  bone]
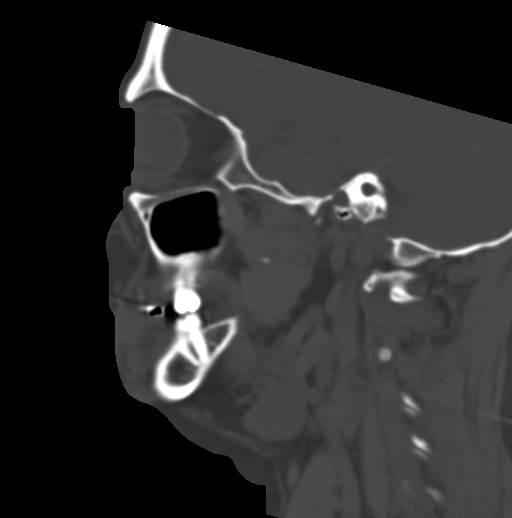

[15 of 47 positions shown; findings below may reference images not displayed]

FINDINGS: Osseous: No acute maxillofacial bone fracture. Bony orbital walls
are intact. Mandible intact without fracture. Bilateral
temporomandibular joints are normal in appearance without
significant arthropathy or malalignment. No bony erosion or
periostitis. No significant periodontal disease.

Orbits: Negative. No traumatic or inflammatory finding.

Sinuses: Paranasal sinuses and mastoid air cells are clear.

Soft tissues: No soft tissue edema or fluid collection. Internal and
external auditory canals are within normal limits.

Limited intracranial: No significant or unexpected finding.
IMPRESSION: Unremarkable study. No findings to explain patient's left-sided
maxillofacial pain.

## 2022-01-16 ENCOUNTER — Ambulatory Visit: Payer: No Typology Code available for payment source

## 2022-03-12 ENCOUNTER — Ambulatory Visit (INDEPENDENT_AMBULATORY_CARE_PROVIDER_SITE_OTHER): Payer: No Typology Code available for payment source | Admitting: Family Medicine

## 2022-03-12 ENCOUNTER — Encounter: Payer: Self-pay | Admitting: Family Medicine

## 2022-03-12 VITALS — BP 144/90 | HR 86 | Temp 98.0°F | Ht 68.0 in | Wt 264.8 lb

## 2022-03-12 DIAGNOSIS — Z13220 Encounter for screening for lipoid disorders: Secondary | ICD-10-CM

## 2022-03-12 DIAGNOSIS — Z0000 Encounter for general adult medical examination without abnormal findings: Secondary | ICD-10-CM | POA: Diagnosis not present

## 2022-03-12 DIAGNOSIS — Z7689 Persons encountering health services in other specified circumstances: Secondary | ICD-10-CM

## 2022-03-12 DIAGNOSIS — Z136 Encounter for screening for cardiovascular disorders: Secondary | ICD-10-CM

## 2022-03-12 DIAGNOSIS — Z1211 Encounter for screening for malignant neoplasm of colon: Secondary | ICD-10-CM | POA: Diagnosis not present

## 2022-03-12 DIAGNOSIS — Z124 Encounter for screening for malignant neoplasm of cervix: Secondary | ICD-10-CM

## 2022-03-12 DIAGNOSIS — R030 Elevated blood-pressure reading, without diagnosis of hypertension: Secondary | ICD-10-CM

## 2022-03-12 DIAGNOSIS — Z1231 Encounter for screening mammogram for malignant neoplasm of breast: Secondary | ICD-10-CM | POA: Insufficient documentation

## 2022-03-12 DIAGNOSIS — R5382 Chronic fatigue, unspecified: Secondary | ICD-10-CM | POA: Diagnosis not present

## 2022-03-12 DIAGNOSIS — R7302 Impaired glucose tolerance (oral): Secondary | ICD-10-CM | POA: Diagnosis not present

## 2022-03-12 NOTE — Progress Notes (Signed)
Complete physical exam  Patient: Tracy Ford   DOB: October 27, 1975   47 y.o. Female  MRN: [id number]  Subjective:    Chief Complaint  Patient presents with   Annual Exam    Pt requesting PAP today. She is not fasting.    Obesity    Pt has concerns about her weight.     Tracy Ford is a 47 y.o. female who presents today for a complete physical exam. She reports consuming a general diet. Home exercise routine includes bike. She generally feels like she is struggling with weight. She reports sleeping well. She does have additional problems to discuss today.   Wants to discuss blood pressure and weight gain. Reports that she has tried everything and nothing seems to work for weight loss. Plans to start riding the stationary bike more at home.  Discussed healthy weight and wellness program. Will make appointment for consultation today.   Most recent fall risk assessment:    03/12/2022    8:33 AM  Fall Risk   Falls in the past year? 0  Number falls in past yr: 0  Injury with Fall? 0  Risk for fall due to : No Fall Risks  Follow up Falls evaluation completed     Most recent depression screenings:    03/12/2022    8:32 AM 02/24/2013    8:52 AM  PHQ 2/9 Scores  PHQ - 2 Score 0 0  PHQ- 9 Score 4         No care team member to display   Outpatient Medications Prior to Visit  Medication Sig   cetirizine (ZYRTEC) 10 MG chewable tablet Chew 10 mg by mouth daily.   fluticasone (FLONASE) 50 MCG/ACT nasal spray Place 2 sprays into both nostrils daily.   ibuprofen (ADVIL) 200 MG tablet Take 200 mg by mouth every 6 (six) hours as needed.   oseltamivir (TAMIFLU) 75 MG capsule Take 75 mg by mouth 2 (two) times daily. (Patient not taking: Reported on 03/12/2022)   predniSONE (DELTASONE) 20 MG tablet Take 3 tabs PO daily x 5 days. (Patient not taking: Reported on 03/12/2022)   [DISCONTINUED] ibuprofen (ADVIL) 200 MG tablet Take 200 mg by mouth every 6 (six) hours as needed.   No  facility-administered medications prior to visit.    Review of Systems  Constitutional:  Negative for chills and fever.  HENT:  Negative for ear pain and hearing loss.   Respiratory:  Negative for shortness of breath.   Cardiovascular:  Negative for chest pain and leg swelling.  Gastrointestinal:  Negative for abdominal pain, diarrhea, nausea and vomiting.          Objective:     BP (!) 144/90 (BP Location: Right Arm, Patient Position: Sitting, Cuff Size: Large)   Pulse 86   Temp 98 F (36.7 C) (Oral)   Ht 5\' 8"  (1.727 m)   Wt 264 lb 12.8 oz (120.1 kg)   LMP 03/10/2022 (Approximate)   SpO2 98%   BMI 40.26 kg/m    Physical Exam Vitals and nursing note reviewed. Exam conducted with a chaperone present.  Constitutional:      General: She is not in acute distress.    Appearance: Normal appearance. She is obese.  HENT:     Right Ear: Tympanic membrane normal.     Left Ear: Tympanic membrane normal.     Nose: Nose normal.     Mouth/Throat:     Pharynx: Uvula midline. No pharyngeal swelling, oropharyngeal exudate,  posterior oropharyngeal erythema or uvula swelling.  Eyes:     Extraocular Movements: Extraocular movements intact.  Neck:     Thyroid: No thyroid mass.  Cardiovascular:     Rate and Rhythm: Normal rate and regular rhythm.     Pulses: Normal pulses.     Heart sounds: Normal heart sounds.  Pulmonary:     Effort: Pulmonary effort is normal.     Breath sounds: Normal breath sounds.  Abdominal:     General: Bowel sounds are normal.     Palpations: Abdomen is soft.     Tenderness: There is no abdominal tenderness.  Genitourinary:    General: Normal vulva.     Exam position: Lithotomy position.     Labia:        Right: No lesion.        Left: No lesion.      Vagina: No vaginal discharge.     Cervix: No cervical bleeding.     Adnexa: Right adnexa normal and left adnexa normal.  Musculoskeletal:     Cervical back: Normal range of motion.  Lymphadenopathy:      Cervical:     Right cervical: No superficial cervical adenopathy.    Left cervical: No superficial cervical adenopathy.  Skin:    General: Skin is warm and dry.  Neurological:     General: No focal deficit present.     Mental Status: She is alert. Mental status is at baseline.  Psychiatric:        Mood and Affect: Mood normal.        Behavior: Behavior normal.        Thought Content: Thought content normal.        Judgment: Judgment normal.      No results found for any visits on 03/12/22.     Assessment & Plan:    Routine Health Maintenance and Physical Exam  Immunization History  Administered Date(s) Administered   Influenza, Seasonal, Injecte, Preservative Fre 02/21/2012   Influenza,inj,Quad PF,6+ Mos 02/24/2013   Influenza-Unspecified 12/04/2013, 11/30/2014, 01/11/2016, 01/16/2019, 12/29/2021   PFIZER(Purple Top)SARS-COV-2 Vaccination 05/20/2019, 06/10/2019, 12/18/2019   Tdap 02/21/2012, 10/27/2014    Health Maintenance  Topic Date Due   COVID-19 Vaccine (4 - 2023-24 season) 03/28/2022 (Originally 11/03/2021)   COLONOSCOPY (Pts 45-47yrs Insurance coverage will need to be confirmed)  04/30/2022 (Originally 09/04/2019)   Hepatitis C Screening  03/13/2023 (Originally 09/03/1992)   DTaP/Tdap/Td (3 - Td or Tdap) 10/26/2024   PAP SMEAR-Modifier  03/12/2025   INFLUENZA VACCINE  Completed   HIV Screening  Completed   HPV VACCINES  Aged Out    Discussed health benefits of physical activity, and encouraged her to engage in regular exercise appropriate for her age and condition.  Problem List Items Addressed This Visit     Encounter for screening mammogram for malignant neoplasm of breast - Primary   Relevant Orders   MM Digital Screening   Impaired glucose tolerance   Relevant Orders   Comprehensive Metabolic Panel (CMET)   HgB A1c   Chronic fatigue   Relevant Orders   CBC w/Diff/Platelet   TSH + free T4   Screening for cervical cancer   Relevant Orders   IGP,  Aptima HPV   Elevated blood pressure reading in office without diagnosis of hypertension  Annual exam with routine labs. Pap with HPV. Screening mammogram ordered. Declines colonoscopy, agrees to Cologuard, order placed. Blood pressure elevated in clinic today. Will take BP 3-4 times per week, keep a  log and return in 4 weeks to review/treat if needed.  Will make appointment with [facility] for consultation to discuss program.  Agrees with plan of care discussed today. Questions answered.    Return in about 4 weeks (around 04/09/2022) for blood pressure.     Chalmers Guest, FNP  cmp

## 2022-03-12 NOTE — Patient Instructions (Signed)
Follow-up in 4 weeks with blood pressure log.

## 2022-03-13 LAB — LIPID PANEL
Chol/HDL Ratio: 3.3 ratio (ref 0.0–4.4)
Cholesterol, Total: 191 mg/dL (ref 100–199)
HDL: 58 mg/dL (ref >39)
LDL Chol Calc (NIH): 107 mg/dL — ABNORMAL HIGH (ref 0–99)
Triglycerides: 147 mg/dL (ref 0–149)
VLDL Cholesterol Cal: 26 mg/dL (ref 5–40)

## 2022-03-13 LAB — CBC WITH DIFFERENTIAL/PLATELET
Basophils Absolute: 0.1 10*3/uL (ref 0.0–0.2)
Basos: 1 %
EOS (ABSOLUTE): 0.2 10*3/uL (ref 0.0–0.4)
Eos: 3 %
Hematocrit: 40.2 % (ref 34.0–46.6)
Hemoglobin: 14 g/dL (ref 11.1–15.9)
Immature Grans (Abs): 0 10*3/uL (ref 0.0–0.1)
Immature Granulocytes: 0 %
Lymphocytes Absolute: 1.5 10*3/uL (ref 0.7–3.1)
Lymphs: 30 %
MCH: 31 pg (ref 26.6–33.0)
MCHC: 34.8 g/dL (ref 31.5–35.7)
MCV: 89 fL (ref 79–97)
Monocytes Absolute: 0.3 10*3/uL (ref 0.1–0.9)
Monocytes: 6 %
Neutrophils Absolute: 2.9 10*3/uL (ref 1.4–7.0)
Neutrophils: 60 %
Platelets: 189 10*3/uL (ref 150–450)
RBC: 4.51 x10E6/uL (ref 3.77–5.28)
RDW: 12.9 % (ref 11.7–15.4)
WBC: 5 10*3/uL (ref 3.4–10.8)

## 2022-03-13 LAB — COMPREHENSIVE METABOLIC PANEL
ALT: 37 IU/L — ABNORMAL HIGH (ref 0–32)
AST: 29 IU/L (ref 0–40)
Albumin/Globulin Ratio: 1.9 (ref 1.2–2.2)
Albumin: 4.4 g/dL (ref 3.9–4.9)
Alkaline Phosphatase: 67 IU/L (ref 44–121)
BUN/Creatinine Ratio: 16 (ref 9–23)
BUN: 12 mg/dL (ref 6–24)
Bilirubin Total: 0.2 mg/dL (ref 0.0–1.2)
CO2: 18 mmol/L — ABNORMAL LOW (ref 20–29)
Calcium: 9 mg/dL (ref 8.7–10.2)
Chloride: 103 mmol/L (ref 96–106)
Creatinine, Ser: 0.77 mg/dL (ref 0.57–1.00)
Globulin, Total: 2.3 g/dL (ref 1.5–4.5)
Glucose: 90 mg/dL (ref 70–99)
Potassium: 4.4 mmol/L (ref 3.5–5.2)
Sodium: 137 mmol/L (ref 134–144)
Total Protein: 6.7 g/dL (ref 6.0–8.5)
eGFR: 96 mL/min/{1.73_m2} (ref >59)

## 2022-03-13 LAB — HEMOGLOBIN A1C
Est. average glucose Bld gHb Est-mCnc: 100 mg/dL
Hgb A1c MFr Bld: 5.1 % (ref 4.8–5.6)

## 2022-03-13 LAB — TSH+FREE T4
Free T4: 1.14 ng/dL (ref 0.82–1.77)
TSH: 3.16 u[IU]/mL (ref 0.450–4.500)

## 2022-03-16 ENCOUNTER — Encounter: Payer: Self-pay | Admitting: Family Medicine

## 2022-03-16 LAB — IGP, APTIMA HPV
HPV Aptima: NEGATIVE
PAP Smear Comment: 0

## 2022-03-21 ENCOUNTER — Encounter: Payer: Self-pay | Admitting: Bariatrics

## 2022-03-21 ENCOUNTER — Ambulatory Visit (INDEPENDENT_AMBULATORY_CARE_PROVIDER_SITE_OTHER): Payer: No Typology Code available for payment source | Admitting: Bariatrics

## 2022-03-21 VITALS — BP 132/87 | HR 85 | Temp 98.2°F | Ht 68.0 in | Wt 263.0 lb

## 2022-03-21 DIAGNOSIS — E668 Other obesity: Secondary | ICD-10-CM

## 2022-03-21 DIAGNOSIS — R7302 Impaired glucose tolerance (oral): Secondary | ICD-10-CM

## 2022-03-21 DIAGNOSIS — Z6841 Body Mass Index (BMI) 40.0 and over, adult: Secondary | ICD-10-CM

## 2022-03-21 DIAGNOSIS — E65 Localized adiposity: Secondary | ICD-10-CM

## 2022-03-21 DIAGNOSIS — R5382 Chronic fatigue, unspecified: Secondary | ICD-10-CM

## 2022-03-29 NOTE — Progress Notes (Signed)
Office: [phone]  /  Fax: [phone]   Initial Visit  Tracy Ford was seen in clinic today to evaluate for obesity. She is interested in losing weight to improve overall health and reduce the risk of weight related complications. She presents today to review program treatment options, initial physical assessment, and evaluation.     She was referred by: PCP  When asked what else they would like to accomplish? She states: Adopt healthier eating patterns, Improve energy levels and physical activity, and Improve quality of life  When asked how has your weight affected you? She states: Other: none  Some associated conditions: Arthritis: 3 knee surgeries and Other: Torn ACL  Contributing factors: Family history  Weight promoting medications identified: None  Current nutrition plan: None and Portion control / smart choices, balanced meals.  Current level of physical activity: None  Current or previous pharmacotherapy: None  Response to medication: Never tried medications   Past medical history includes:   Past Medical History:  Diagnosis Date   Family history of breast cancer in first degree relative 02/24/2013   HIP PAIN, LEFT 06/30/2009   Qualifier: Diagnosis of   By: Madilyn Fireman MD, Catherine       Right knee pain 12/11/2013     Objective:   BP 132/87   Pulse 85   Temp 98.2 F (36.8 C)   Ht 5\' 8"  (1.727 m)   Wt 263 lb (119.3 kg)   LMP 03/10/2022 (Approximate)   SpO2 100%   BMI 39.99 kg/m  She was weighed on the bioimpedance scale: Body mass index is 39.99 kg/m.  Peak Weight:263 lbs ,Visceral Fat Rating:14, Body Fat%:47.4, Weight trend over the last 12 months: Increasing  General:  Alert, oriented and cooperative. Patient is in no acute distress.  Respiratory: Normal respiratory effort, no problems with respiration noted  Extremities: Normal range of motion.    Mental Status: Normal mood and affect. Normal behavior. Normal judgment and thought content.    Assessment and Plan:  1. Impaired glucose tolerance No medications.  Plan: 1.  Will decrease all carbohydrates (starches and sugars).  2. Chronic fatigue Certain activities.  Plan:  1.  Will do indirect calorimetry and EKG.  3. Visceral obesity Visceral fat-14.  Plan: To get her visceral fat less than 13.  4. Obesity, Current BMI 40.1  We reviewed weight, biometrics, associated medical conditions and contributing factors with patient. She would benefit from weight loss therapy via a modified calorie, low-carb, high-protein nutritional plan tailored to their REE (resting energy expenditure) which will be determined by indirect calorimetry.  We will also assess for cardiometabolic risk and nutritional derangements via fasting serologies at her next appointment.      Obesity Treatment / Action Plan:  Will work on eliminating or reducing the presence of highly palatable, calorie dense foods in the home. Will be scheduled for indirect calorimetry to determine resting energy expenditure in a fasting state.  This will allow [place] to create a reduced calorie, high-protein meal plan to promote loss of fat mass while preserving muscle mass. Will work on reducing intake of added sugars, simple sugars and processed carbs. Was counseled on nutritional approaches to weight loss and benefits of complex carbs and high quality protein as part of nutritional weight management. Will work on increasing water intake with a goal of 125 ounces for men and 91 ounces for women.   Will return to office in 2 weeks for EKG, indirect calorimetry and labs.  Obesity Education Performed Today:  She was weighed on the bioimpedance scale and results were discussed and documented in the synopsis.  We discussed obesity as a disease and the importance of a more detailed evaluation of all the factors contributing to the disease.  We discussed the importance of long term lifestyle changes which include nutrition,  exercise and behavioral modifications as well as the importance of customizing this to her specific health and social needs.  We discussed the benefits of reaching a healthier weight to alleviate the symptoms of existing conditions and reduce the risks of the biomechanical, metabolic and psychological effects of obesity.  Shelley Cocke appears to be in the action stage of change and states they are ready to start intensive lifestyle modifications and behavioral modifications.   Reviewed by clinician on day of visit: allergies, medications, problem list, medical history, surgical history, family history, social history, and previous encounter notes.   I, Dawn Whitmire, FNP-C, am acting as transcriptionist for Dr. Jearld Lesch.   I have reviewed the above documentation for accuracy and completeness, and I agree with the above. Jearld Lesch, DO

## 2022-04-02 ENCOUNTER — Encounter: Payer: Self-pay | Admitting: Bariatrics

## 2022-04-02 DIAGNOSIS — Z0289 Encounter for other administrative examinations: Secondary | ICD-10-CM

## 2022-04-03 ENCOUNTER — Encounter: Payer: Self-pay | Admitting: Bariatrics

## 2022-04-03 ENCOUNTER — Ambulatory Visit (INDEPENDENT_AMBULATORY_CARE_PROVIDER_SITE_OTHER): Payer: No Typology Code available for payment source | Admitting: Bariatrics

## 2022-04-03 VITALS — BP 125/92 | HR 76 | Temp 98.0°F | Ht 68.0 in | Wt 262.0 lb

## 2022-04-03 DIAGNOSIS — E559 Vitamin D deficiency, unspecified: Secondary | ICD-10-CM | POA: Diagnosis not present

## 2022-04-03 DIAGNOSIS — R7302 Impaired glucose tolerance (oral): Secondary | ICD-10-CM

## 2022-04-03 DIAGNOSIS — E668 Other obesity: Secondary | ICD-10-CM

## 2022-04-03 DIAGNOSIS — R0602 Shortness of breath: Secondary | ICD-10-CM | POA: Insufficient documentation

## 2022-04-03 DIAGNOSIS — Z0000 Encounter for general adult medical examination without abnormal findings: Secondary | ICD-10-CM | POA: Insufficient documentation

## 2022-04-03 DIAGNOSIS — R030 Elevated blood-pressure reading, without diagnosis of hypertension: Secondary | ICD-10-CM | POA: Diagnosis not present

## 2022-04-03 DIAGNOSIS — E538 Deficiency of other specified B group vitamins: Secondary | ICD-10-CM | POA: Diagnosis not present

## 2022-04-03 DIAGNOSIS — R5383 Other fatigue: Secondary | ICD-10-CM | POA: Diagnosis not present

## 2022-04-03 DIAGNOSIS — Z1211 Encounter for screening for malignant neoplasm of colon: Secondary | ICD-10-CM | POA: Insufficient documentation

## 2022-04-03 DIAGNOSIS — Z6839 Body mass index (BMI) 39.0-39.9, adult: Secondary | ICD-10-CM

## 2022-04-03 DIAGNOSIS — E669 Obesity, unspecified: Secondary | ICD-10-CM | POA: Insufficient documentation

## 2022-04-03 DIAGNOSIS — Z1331 Encounter for screening for depression: Secondary | ICD-10-CM | POA: Diagnosis not present

## 2022-04-03 DIAGNOSIS — E65 Localized adiposity: Secondary | ICD-10-CM

## 2022-04-04 LAB — VITAMIN D 25 HYDROXY (VIT D DEFICIENCY, FRACTURES): Vit D, 25-Hydroxy: 28.5 ng/mL — ABNORMAL LOW (ref 30.0–100.0)

## 2022-04-04 LAB — VITAMIN B12: Vitamin B-12: 419 pg/mL (ref 232–1245)

## 2022-04-04 LAB — INSULIN, RANDOM: INSULIN: 8.9 u[IU]/mL (ref 2.6–24.9)

## 2022-04-09 ENCOUNTER — Ambulatory Visit: Payer: No Typology Code available for payment source | Admitting: Family Medicine

## 2022-04-13 ENCOUNTER — Encounter: Payer: Self-pay | Admitting: Family Medicine

## 2022-04-13 ENCOUNTER — Ambulatory Visit (INDEPENDENT_AMBULATORY_CARE_PROVIDER_SITE_OTHER): Payer: No Typology Code available for payment source | Admitting: Family Medicine

## 2022-04-13 VITALS — BP 121/88 | HR 76 | Temp 98.1°F | Ht 68.0 in | Wt 261.0 lb

## 2022-04-13 DIAGNOSIS — R030 Elevated blood-pressure reading, without diagnosis of hypertension: Secondary | ICD-10-CM

## 2022-04-13 NOTE — Assessment & Plan Note (Signed)
Blood pressure log reveals pressures that are well-controlled. She is actively working on weight loss. Recommend continuing to monitor blood pressures at home and report to office if elevations are seen. She is working with [employer] for medical weight loss currently.

## 2022-04-13 NOTE — Progress Notes (Signed)
   Established Patient Office Visit  Subjective   Patient ID: Tracy Ford, female    DOB: 1975/08/21  Age: 47 y.o. MRN: [id number]  Chief Complaint  Patient presents with   Hypertension    Pt does have her BP Log.    Headache    States that she was put on a high protein diet at [facility]. Headaches have began to increase.     HPI  Hypertension Medication compliance: not currently on medication for HTN  Denies chest pain, shortness of breath, lower extremity edema, vision changes, headaches.  Pertinent lab work: 03/12/22  Monitoring at home: monitors at home, 130/80 Tolerating medication well: n/a Continue current medication regimen: n/a Follow-up: one year   Has been on high protein diet per [facility] and waking up with headache that seems to resolve. Has lost 5 pounds in 2 weeks. Feels well after headache resolves.   Review of Systems  Eyes:  Negative for blurred vision and double vision.  Respiratory:  Negative for shortness of breath.   Cardiovascular:  Negative for chest pain.  Neurological:  Positive for headaches (diet change). Negative for dizziness.      Objective:     BP 121/88   Pulse 76   Temp 98.1 F (36.7 C) (Oral)   Ht 5\' 8"  (1.727 m)   Wt 261 lb (118.4 kg)   SpO2 98%   BMI 39.68 kg/m  BP Readings from Last 3 Encounters:  04/13/22 121/88  04/03/22 (!) 125/92  03/21/22 132/87      Physical Exam Vitals and nursing note reviewed.  Constitutional:      General: She is not in acute distress.    Appearance: She is well-developed. She is obese.  Cardiovascular:     Heart sounds: Normal heart sounds.  Pulmonary:     Effort: Pulmonary effort is normal.     Breath sounds: Normal breath sounds.  Skin:    General: Skin is warm and dry.  Neurological:     General: No focal deficit present.     Mental Status: She is alert. Mental status is at baseline.  Psychiatric:        Mood and Affect: Mood normal.        Behavior: Behavior normal.        Thought  Content: Thought content normal.        Judgment: Judgment normal.      No results found for any visits on 04/13/22.    The 10-year ASCVD risk score (Arnett DK, et al., 2019) is: 0.7%    Assessment & Plan:   Problem List Items Addressed This Visit     Elevated blood pressure reading in office without diagnosis of hypertension - Primary    Blood pressure log reveals pressures that are well-controlled. She is actively working on weight loss. Recommend continuing to monitor blood pressures at home and report to office if elevations are seen. She is working with [employer] for medical weight loss currently.      Agrees with plan of care discussed.  Questions answered.   Return in about 1 year (around 04/14/2023) for cpe.    Chalmers Guest, FNP

## 2022-04-17 ENCOUNTER — Encounter: Payer: Self-pay | Admitting: Bariatrics

## 2022-04-17 ENCOUNTER — Ambulatory Visit (INDEPENDENT_AMBULATORY_CARE_PROVIDER_SITE_OTHER): Payer: No Typology Code available for payment source | Admitting: Bariatrics

## 2022-04-17 VITALS — BP 137/89 | HR 74 | Temp 98.0°F | Ht 68.0 in | Wt 259.0 lb

## 2022-04-17 DIAGNOSIS — Z6839 Body mass index (BMI) 39.0-39.9, adult: Secondary | ICD-10-CM | POA: Diagnosis not present

## 2022-04-17 DIAGNOSIS — E559 Vitamin D deficiency, unspecified: Secondary | ICD-10-CM | POA: Diagnosis not present

## 2022-04-17 DIAGNOSIS — E669 Obesity, unspecified: Secondary | ICD-10-CM | POA: Diagnosis not present

## 2022-04-17 DIAGNOSIS — R030 Elevated blood-pressure reading, without diagnosis of hypertension: Secondary | ICD-10-CM | POA: Diagnosis not present

## 2022-04-17 MED ORDER — VITAMIN D (ERGOCALCIFEROL) 1.25 MG (50000 UNIT) PO CAPS: 50000.0000 [IU] | ORAL_CAPSULE | ORAL | 0 refills | Status: DC

## 2022-04-18 NOTE — Progress Notes (Unsigned)
Chief Complaint:   OBESITY Tracy Ford (MR# [id number]) is a 47 y.o. female who presents for evaluation and treatment of obesity and related comorbidities. Current BMI is Body mass index is 39.84 kg/m. Tracy Ford has been struggling with her weight for many years and has been unsuccessful in either losing weight, maintaining weight loss, or reaching her healthy weight goal.  Tracy Ford is here as a new patient. She had her information session on 03/21/2022.  Tracy Ford is currently in the action stage of change and ready to dedicate time achieving and maintaining a healthier weight. Tracy Ford is interested in becoming our patient and working on intensive lifestyle modifications including (but not limited to) diet and exercise for weight loss.  Tracy Ford's habits were reviewed today and are as follows: Her family eats meals together, she thinks her family will eat healthier with her, her desired weight loss is 82 lbs, she has been heavy most of her life, she started gaining weight after having children, her heaviest weight ever was 265 pounds, she has significant food cravings issues, she is frequently drinking liquids with calories, she frequently makes poor food choices, she has problems with excessive hunger, she frequently eats larger portions than normal, and she struggles with emotional eating.  Depression Screen Tracy Ford's Food and Mood (modified PHQ-9) score was 12.  Subjective:   1. Other fatigue Tracy Ford admits to daytime somnolence and admits to waking up still tired. Patient has a history of symptoms of daytime fatigue and morning fatigue. Tracy Ford generally gets 6 or 7 hours of sleep per night, and states that she has nightime awakenings and generally restful sleep. Snoring is present. Apneic episodes are not present. Epworth Sleepiness Score is 11.   2. SOB (shortness of breath) on exertion Tracy Ford notes increasing shortness of breath with exercising and seems to be worsening  over time with weight gain. She notes getting out of breath sooner with activity than she used to. This has not gotten worse recently. Tracy Ford denies shortness of breath at rest or orthopnea.  3. Visceral obesity Tracy Ford's visceral fat rating is 13%.  4. Impaired glucose tolerance Tracy Ford is not on medications currently and she denies a family history.  5. B12 deficiency Tracy Ford is not on multivitamins.  6. Vitamin D deficiency Tracy Ford is not on multivitamins.  7. Health care maintenance Given obesity.   8. Elevated blood pressure reading in office without diagnosis of hypertension Tracy Ford's blood pressure is elevated today without a diagnosis of hypertension.  Her home readings ranges in the 130s/80s.  Assessment/Plan:   1. Other fatigue Tracy Ford does feel that her weight is causing her energy to be lower than it should be. Fatigue may be related to obesity, depression or many other causes. Labs will be ordered, and in the meanwhile, Tracy Ford will focus on self care including making healthy food choices, increasing physical activity and focusing on stress reduction.  - EKG 12-Lead  2. SOB (shortness of breath) on exertion Tracy Ford does feel that she gets out of breath more easily that she used to when she exercises. Tracy Ford's shortness of breath appears to be obesity related and exercise induced. She has agreed to work on weight loss and gradually increase exercise to treat her exercise induced shortness of breath. Will continue to monitor closely.  3. Visceral obesity Goal is to be below 13% for visceral fat.  4. Impaired glucose tolerance We will check labs today, and we will follow-up at [place] next visit.  - Insulin, random  5. B12 deficiency We will check labs today, and we will follow-up at Tracy Ford next visit.  - Vitamin B12  6. Vitamin D deficiency We will check labs today, and we will follow-up at Tracy Ford next visit.  - VITAMIN D 25 Hydroxy  (Vit-D Deficiency, Fractures)  7. Health care maintenance We will check labs today. IC and EKG were done today, and results were reviewed with the patient.   8. Elevated blood pressure reading in office without diagnosis of hypertension Tracy Ford is to continue checking her blood pressure at home, and follow-up with her PCP.  9. Depression screening Tracy Ford had a positive depression screening. Depression is commonly associated with obesity and often results in emotional eating behaviors. We will monitor this closely and work on CBT to help improve the non-hunger eating patterns. Referral to Psychology may be required if no improvement is seen as she continues in our clinic.  10. Generalized obesity  11. BMI 39.0-39.9,adult Tracy Ford is currently in the action stage of change and her goal is to continue with weight loss efforts. I recommend Tracy Ford begin the structured treatment plan as follows:  She has agreed to the Category 4 Plan and keeping a food journal and adhering to recommended goals of 1800 calories and 100-120 grams of protein daily.  Meal planning was discussed. Reviewed labs from 03/12/2022, CMP, lipid, CBC, A1c, and thyroid panel.   Exercise goals: No exercise has been prescribed at this time.   Behavioral modification strategies: increasing lean protein intake, decreasing simple carbohydrates, increasing vegetables, increasing water intake, decreasing eating out, no skipping meals, meal planning and cooking strategies, keeping healthy foods in the home, and planning for success.  She was informed of the importance of frequent follow-up visits to maximize her success with intensive lifestyle modifications for her multiple health conditions. She was informed we would discuss her lab results at her next visit unless there is a critical issue that needs to be addressed sooner. Tracy Ford agreed to keep her next visit at the agreed upon time to discuss these results.  Objective:    Blood pressure (!) 125/92, pulse 76, temperature 98 F (36.7 C), height 5' 8"$  (1.727 m), weight 262 lb (118.8 kg), last menstrual period 03/10/2022, SpO2 100 %. Body mass index is 39.84 kg/m.  EKG: Normal sinus rhythm, rate 69 BPM.  Indirect Calorimeter completed today shows a VO2 of 382 and a REE of 2635.  Her calculated basal metabolic rate is 0000000 thus her basal metabolic rate is better than expected.  General: Cooperative, alert, well developed, in no acute distress. HEENT: Conjunctivae and lids unremarkable. Cardiovascular: Regular rhythm.  Lungs: Normal work of breathing. Neurologic: No focal deficits.   Lab Results  Component Value Date   CREATININE 0.77 03/12/2022   BUN 12 03/12/2022   NA 137 03/12/2022   K 4.4 03/12/2022   CL 103 03/12/2022   CO2 18 (L) 03/12/2022   Lab Results  Component Value Date   ALT 37 (H) 03/12/2022   AST 29 03/12/2022   ALKPHOS 67 03/12/2022   BILITOT 0.2 03/12/2022   Lab Results  Component Value Date   HGBA1C 5.1 03/12/2022   HGBA1C 4.5 03/20/2016   HGBA1C 5.0 02/23/2014   HGBA1C 5.0 02/24/2013   Lab Results  Component Value Date   INSULIN 8.9 04/03/2022   Lab Results  Component Value Date   TSH 3.160 03/12/2022   Lab Results  Component Value Date   CHOL 191 03/12/2022   HDL 58 03/12/2022  LDLCALC 107 (H) 03/12/2022   TRIG 147 03/12/2022   CHOLHDL 3.3 03/12/2022   Lab Results  Component Value Date   WBC 5.0 03/12/2022   HGB 14.0 03/12/2022   HCT 40.2 03/12/2022   MCV 89 03/12/2022   PLT 189 03/12/2022   No results found for: "IRON", "TIBC", "FERRITIN"  Attestation Statements:   Reviewed by clinician on day of visit: allergies, medications, problem list, medical history, surgical history, family history, social history, and previous encounter notes.  Time spent on visit including pre-visit chart review and post-visit charting and care was 60 minutes.   Wilhemena Durie, am acting as Location manager for [employer], DO.  I have reviewed the above documentation for accuracy and completeness, and I agree with the above. Jearld Lesch, DO

## 2022-04-19 ENCOUNTER — Encounter: Payer: Self-pay | Admitting: Bariatrics

## 2022-05-01 ENCOUNTER — Ambulatory Visit (INDEPENDENT_AMBULATORY_CARE_PROVIDER_SITE_OTHER): Payer: No Typology Code available for payment source | Admitting: Bariatrics

## 2022-05-01 ENCOUNTER — Encounter: Payer: Self-pay | Admitting: Bariatrics

## 2022-05-01 VITALS — BP 126/79 | HR 72 | Temp 97.8°F | Ht 68.0 in | Wt 261.0 lb

## 2022-05-01 DIAGNOSIS — R632 Polyphagia: Secondary | ICD-10-CM

## 2022-05-01 DIAGNOSIS — Z6839 Body mass index (BMI) 39.0-39.9, adult: Secondary | ICD-10-CM

## 2022-05-01 DIAGNOSIS — R7302 Impaired glucose tolerance (oral): Secondary | ICD-10-CM | POA: Diagnosis not present

## 2022-05-01 DIAGNOSIS — E559 Vitamin D deficiency, unspecified: Secondary | ICD-10-CM | POA: Diagnosis not present

## 2022-05-01 DIAGNOSIS — E669 Obesity, unspecified: Secondary | ICD-10-CM | POA: Diagnosis not present

## 2022-05-01 MED ORDER — WEGOVY 0.25 MG/0.5ML ~~LOC~~ SOAJ: 0.2500 mg | SUBCUTANEOUS | 0 refills | Status: DC

## 2022-05-01 NOTE — Progress Notes (Unsigned)
Chief Complaint:   OBESITY Tracy Ford is here to discuss her progress with her obesity treatment plan along with follow-up of her obesity related diagnoses. Tracy Ford is on the Category 4 Plan and keeping a food journal and adhering to recommended goals of 1800 calories and 100-120 grams of protein and states she is following her eating plan approximately 75% of the time. Tracy Ford states she is doing 0 minutes 0 times per week.  Today's visit was #: 2 Starting weight: 262 lbs Starting date: 04/03/2022 Today's weight: 259 lbs Today's date: 04/17/2022 Total lbs lost to date: 3 Total lbs lost since last in-office visit: 3  Interim History: Tracy Ford is down 3 lbs since her first visit. She is having headaches starting in morning over the past 2 weeks. She has allergies, and she is taking Flonase and Zyrtec. She is drinking enough water per the patient. She is eating less carbohydrates.   Subjective:   1. Vitamin D insufficiency Tracy Ford's last Vitamin D level was 28.5.  2. Elevated blood pressure reading in office without diagnosis of hypertension Tracy Ford saw her PCP last week. Her last visit blood pressure was 125/92. Her blood pressure has improved today, but still slightly high at 137/81.  Assessment/Plan:   1. Vitamin D insufficiency Tracy Ford agreed to start prescription Vitamin D 50,000 IU weekly with no refills.   - Vitamin D, Ergocalciferol, (DRISDOL) 1.25 MG (50000 UNIT) CAPS capsule; Take 1 capsule (50,000 Units total) by mouth every 7 (seven) days.  Dispense: 5 capsule; Refill: 0  2. Elevated blood pressure reading in office without diagnosis of hypertension We will continue to follow over time. Tracy Ford will work on eliminating added salt, and will begin exercise.   3. Generalized obesity  4. BMI 39.0-39.9,adult Tracy Ford is currently in the action stage of change. As such, her goal is to continue with weight loss efforts. She has agreed to the Category 4 Plan and  keeping a food journal and adhering to recommended goals of 1800 calories and 100-120 grams of protein.   Exercise goals: Patient has a exercise bike at home.   Behavioral modification strategies: increasing lean protein intake, decreasing simple carbohydrates, increasing vegetables, increasing water intake, decreasing eating out, no skipping meals, meal planning and cooking strategies, keeping healthy foods in the home, and planning for success.  Tracy Ford has agreed to follow-up with our clinic in 2 weeks. She was informed of the importance of frequent follow-up visits to maximize her success with intensive lifestyle modifications for her multiple health conditions.   Objective:   Blood pressure 137/89, pulse 74, temperature 98 F (36.7 C), height '5\' 8"'$  (1.727 m), weight 259 lb (117.5 kg), SpO2 97 %. Body mass index is 39.38 kg/m.  General: Cooperative, alert, well developed, in no acute distress. HEENT: Conjunctivae and lids unremarkable. Cardiovascular: Regular rhythm.  Lungs: Normal work of breathing. Neurologic: No focal deficits.   Lab Results  Component Value Date   CREATININE 0.77 03/12/2022   BUN 12 03/12/2022   NA 137 03/12/2022   K 4.4 03/12/2022   CL 103 03/12/2022   CO2 18 (L) 03/12/2022   Lab Results  Component Value Date   ALT 37 (H) 03/12/2022   AST 29 03/12/2022   ALKPHOS 67 03/12/2022   BILITOT 0.2 03/12/2022   Lab Results  Component Value Date   HGBA1C 5.1 03/12/2022   HGBA1C 4.5 03/20/2016   HGBA1C 5.0 02/23/2014   HGBA1C 5.0 02/24/2013   Lab Results  Component Value Date  INSULIN 8.9 04/03/2022   Lab Results  Component Value Date   TSH 3.160 03/12/2022   Lab Results  Component Value Date   CHOL 191 03/12/2022   HDL 58 03/12/2022   LDLCALC 107 (H) 03/12/2022   TRIG 147 03/12/2022   CHOLHDL 3.3 03/12/2022   Lab Results  Component Value Date   VD25OH 28.5 (L) 04/03/2022   Lab Results  Component Value Date   WBC 5.0 03/12/2022   HGB  14.0 03/12/2022   HCT 40.2 03/12/2022   MCV 89 03/12/2022   PLT 189 03/12/2022   No results found for: "IRON", "TIBC", "FERRITIN"  Attestation Statements:   Reviewed by clinician on day of visit: allergies, medications, problem list, medical history, surgical history, family history, social history, and previous encounter notes.   Wilhemena Durie, am acting as Location manager for [employer], DO.  I have reviewed the above documentation for accuracy and completeness, and I agree with the above. Jearld Lesch, DO

## 2022-05-02 ENCOUNTER — Encounter: Payer: Self-pay | Admitting: Bariatrics

## 2022-05-16 ENCOUNTER — Encounter: Payer: Self-pay | Admitting: Bariatrics

## 2022-05-16 ENCOUNTER — Ambulatory Visit: Payer: No Typology Code available for payment source | Admitting: Bariatrics

## 2022-05-16 NOTE — Progress Notes (Signed)
Chief Complaint:   OBESITY Tracy Ford is here to discuss her progress with her obesity treatment plan along with follow-up of her obesity related diagnoses. Tracy Ford is on the Category 4 plan and keeping a food journal with goal of 1800 calories and 100-120 grams of protein daily and states she is following her eating plan approximately 20% of the time. Tracy Ford states has not been exercising.  Today's visit was #: 3 Starting weight: 262 lbs Starting date: 04/03/22 Today's weight: 261 lbs Today's date: 05/01/22 Total lbs lost to date: 1 Total lbs lost since last in-office visit: +2  Interim History: She is up 2 pounds since her last visit.  She has had more stress per a death in the family.  Her bioimpedance shows that she is up 3.4 pounds of water.  Subjective:   1. Vitamin D insufficiency Taking vitamin D.  2. Impaired glucose tolerance No medications.  3. Polyphagia Increased hunger in the evening before dinner.  Doing well with water, but struggles with the protein.   Assessment/Plan:   1. Vitamin D insufficiency 1.  Continue vitamin D.  2. Impaired glucose tolerance 1.  Continue to limit all sweets and starches.  3. Polyphagia 1.  Handout - GLP-1 (risks and benefits).  Denies absolute contraindications. 2.  New prescription- - Semaglutide-Weight Management (WEGOVY) 0.25 MG/0.5ML SOAJ; Inject 0.25 mg into the skin once a week.  Dispense: 2 mL; Refill: 0  4. Generalized obesity BMI 39.0-39.9,adult 1.  Meal planning. 2.  Intentional eating. 3.  Consider "My Fitness Pal".  Tracy Ford is currently in the action stage of change. As such, her goal is to continue with weight loss efforts. She has agreed to the Category 4 plan and keeping a food journal with goal of 1800 calories and 100-120 grams of protein daily.  Exercise goals: All adults should avoid inactivity. Some physical activity is better than none, and adults who participate in any amount of physical activity  gain some health benefits.  Behavioral modification strategies: increasing lean protein intake, decreasing simple carbohydrates, increasing vegetables, increasing water intake, decreasing eating out, no skipping meals, meal planning and cooking strategies, keeping healthy foods in the home, avoiding temptations, and planning for success.  Tracy Ford has agreed to follow-up with our clinic in 2 weeks. She was informed of the importance of frequent follow-up visits to maximize her success with intensive lifestyle modifications for her multiple health conditions.    Objective:   Blood pressure 126/79, pulse 72, temperature 97.8 F (36.6 C), height '5\' 8"'$  (1.727 m), weight 261 lb (118.4 kg), SpO2 98 %. Body mass index is 39.68 kg/m.  General: Cooperative, alert, well developed, in no acute distress. HEENT: Conjunctivae and lids unremarkable. Cardiovascular: Regular rhythm.  Lungs: Normal work of breathing. Neurologic: No focal deficits.   Lab Results  Component Value Date   CREATININE 0.77 03/12/2022   BUN 12 03/12/2022   NA 137 03/12/2022   K 4.4 03/12/2022   CL 103 03/12/2022   CO2 18 (L) 03/12/2022   Lab Results  Component Value Date   ALT 37 (H) 03/12/2022   AST 29 03/12/2022   ALKPHOS 67 03/12/2022   BILITOT 0.2 03/12/2022   Lab Results  Component Value Date   HGBA1C 5.1 03/12/2022   HGBA1C 4.5 03/20/2016   HGBA1C 5.0 02/23/2014   HGBA1C 5.0 02/24/2013   Lab Results  Component Value Date   INSULIN 8.9 04/03/2022   Lab Results  Component Value Date   TSH 3.160  03/12/2022   Lab Results  Component Value Date   CHOL 191 03/12/2022   HDL 58 03/12/2022   LDLCALC 107 (H) 03/12/2022   TRIG 147 03/12/2022   CHOLHDL 3.3 03/12/2022   Lab Results  Component Value Date   VD25OH 28.5 (L) 04/03/2022   Lab Results  Component Value Date   WBC 5.0 03/12/2022   HGB 14.0 03/12/2022   HCT 40.2 03/12/2022   MCV 89 03/12/2022   PLT 189 03/12/2022   No results found for:  "IRON", "TIBC", "FERRITIN"  Attestation Statements:   Reviewed by clinician on day of visit: allergies, medications, problem list, medical history, surgical history, family history, social history, and previous encounter notes.  I, Dawn Whitmire, FNP-C, am acting as transcriptionist for Dr. Jearld Lesch.  I have reviewed the above documentation for accuracy and completeness, and I agree with the above. Jearld Lesch, DO

## 2022-05-17 ENCOUNTER — Encounter: Payer: Self-pay | Admitting: Bariatrics

## 2022-05-17 ENCOUNTER — Ambulatory Visit (INDEPENDENT_AMBULATORY_CARE_PROVIDER_SITE_OTHER): Payer: No Typology Code available for payment source | Admitting: Bariatrics

## 2022-05-17 VITALS — BP 117/79 | HR 81 | Temp 97.9°F | Ht 68.0 in | Wt 256.0 lb

## 2022-05-17 DIAGNOSIS — Z6839 Body mass index (BMI) 39.0-39.9, adult: Secondary | ICD-10-CM

## 2022-05-17 DIAGNOSIS — E559 Vitamin D deficiency, unspecified: Secondary | ICD-10-CM | POA: Diagnosis not present

## 2022-05-17 DIAGNOSIS — R632 Polyphagia: Secondary | ICD-10-CM

## 2022-05-17 DIAGNOSIS — E669 Obesity, unspecified: Secondary | ICD-10-CM | POA: Diagnosis not present

## 2022-05-17 MED ORDER — VITAMIN D (ERGOCALCIFEROL) 1.25 MG (50000 UNIT) PO CAPS: 50000.0000 [IU] | ORAL_CAPSULE | ORAL | 0 refills | Status: DC

## 2022-05-23 NOTE — Progress Notes (Signed)
Chief Complaint:   OBESITY Tracy Ford is here to discuss her progress with her obesity treatment plan along with follow-up of her obesity related diagnoses. Tracy Ford is on the Category 4 plan and keeping a food journal with goal of 1800 calories and 100-1 20 grams of protein daily and states she is following her eating plan approximately 35% of the time. Tracy Ford states she has not been exercising.  Today's visit was #: 4 Starting weight: 262 lbs Starting date: 04/03/2022 Today's weight: 256 lbs Today's date: 05/17/2022 Total lbs lost to date: 6 Total lbs lost since last in-office visit: -5  Interim History: She is down 5 pounds since her last visit.  She started the [facility] and could tell a difference.  Subjective:   1. Vitamin D insufficiency Decreased energy.  2. Polyphagia Hungry in the evenings.    Assessment/Plan:   1. Vitamin D insufficiency 1.  Refill- - Vitamin D, Ergocalciferol, (DRISDOL) 1.25 MG (50000 UNIT) CAPS capsule; Take 1 capsule (50,000 Units total) by mouth every 7 (seven) days.  Dispense: 5 capsule; Refill: 0  2. Polyphagia 1.  Continue [id number]. 2.  Will not eat too much or too little.  3. Generalized obesity BMI 39.0-39.9,adult 1.  Meal planning. 2.  Intentional eating. 3.  Will adhere closely to the plan.   Tracy Ford is currently in the action stage of change. As such, her goal is to continue with weight loss efforts. She has agreed to the Category 4 plan.  Exercise goals: More exercise.  Behavioral modification strategies: increasing lean protein intake, decreasing simple carbohydrates, increasing vegetables, increasing water intake, decreasing eating out, no skipping meals, meal planning and cooking strategies, keeping healthy foods in the home, and planning for success.  Tracy Ford has agreed to follow-up with our clinic in 2 weeks. She was informed of the importance of frequent follow-up visits to maximize her success with intensive lifestyle  modifications for her multiple health conditions.   Objective:   Blood pressure 117/79, pulse 81, temperature 97.9 F (36.6 C), height 5\' 8"  (1.727 m), weight 256 lb (116.1 kg), SpO2 98 %. Body mass index is 38.92 kg/m.  General: Cooperative, alert, well developed, in no acute distress. HEENT: Conjunctivae and lids unremarkable. Cardiovascular: Regular rhythm.  Lungs: Normal work of breathing. Neurologic: No focal deficits.   Lab Results  Component Value Date   CREATININE 0.77 03/12/2022   BUN 12 03/12/2022   NA 137 03/12/2022   K 4.4 03/12/2022   CL 103 03/12/2022   CO2 18 (L) 03/12/2022   Lab Results  Component Value Date   ALT 37 (H) 03/12/2022   AST 29 03/12/2022   ALKPHOS 67 03/12/2022   BILITOT 0.2 03/12/2022   Lab Results  Component Value Date   HGBA1C 5.1 03/12/2022   HGBA1C 4.5 03/20/2016   HGBA1C 5.0 02/23/2014   HGBA1C 5.0 02/24/2013   Lab Results  Component Value Date   INSULIN 8.9 04/03/2022   Lab Results  Component Value Date   TSH 3.160 03/12/2022   Lab Results  Component Value Date   CHOL 191 03/12/2022   HDL 58 03/12/2022   LDLCALC 107 (H) 03/12/2022   TRIG 147 03/12/2022   CHOLHDL 3.3 03/12/2022   Lab Results  Component Value Date   VD25OH 28.5 (L) 04/03/2022   Lab Results  Component Value Date   WBC 5.0 03/12/2022   HGB 14.0 03/12/2022   HCT 40.2 03/12/2022   MCV 89 03/12/2022   PLT 189 03/12/2022  No results found for: "IRON", "TIBC", "FERRITIN"  Attestation Statements:   Reviewed by clinician on day of visit: allergies, medications, problem list, medical history, surgical history, family history, social history, and previous encounter notes.  I, Dawn Whitmire, FNP-C, am acting as transcriptionist for Dr. Jearld Lesch.  I have reviewed the above documentation for accuracy and completeness, and I agree with the above. Jearld Lesch, DO

## 2022-05-30 ENCOUNTER — Encounter: Payer: Self-pay | Admitting: Bariatrics

## 2022-06-06 ENCOUNTER — Encounter: Payer: Self-pay | Admitting: Bariatrics

## 2022-06-06 ENCOUNTER — Ambulatory Visit (INDEPENDENT_AMBULATORY_CARE_PROVIDER_SITE_OTHER): Payer: No Typology Code available for payment source | Admitting: Bariatrics

## 2022-06-06 DIAGNOSIS — R632 Polyphagia: Secondary | ICD-10-CM | POA: Diagnosis not present

## 2022-06-06 DIAGNOSIS — E669 Obesity, unspecified: Secondary | ICD-10-CM | POA: Diagnosis not present

## 2022-06-06 DIAGNOSIS — E559 Vitamin D deficiency, unspecified: Secondary | ICD-10-CM

## 2022-06-06 DIAGNOSIS — Z6838 Body mass index (BMI) 38.0-38.9, adult: Secondary | ICD-10-CM | POA: Diagnosis not present

## 2022-06-06 MED ORDER — VITAMIN D (ERGOCALCIFEROL) 1.25 MG (50000 UNIT) PO CAPS: 50000.0000 [IU] | ORAL_CAPSULE | ORAL | 0 refills | Status: DC

## 2022-06-06 MED ORDER — WEGOVY 0.5 MG/0.5ML ~~LOC~~ SOAJ: 0.5000 mg | SUBCUTANEOUS | 0 refills | Status: DC

## 2022-06-06 NOTE — Progress Notes (Signed)
   WEIGHT SUMMARY AND BIOMETRICS  Weight Lost Since Last Visit: 2lb    Vitals Temp: 98 F (36.7 C) BP: 122/85 Pulse Rate: 77 SpO2: 98 %   Anthropometric Measurements Height: 5\' 8"  ([id number] m) Weight: 254 lb (115.2 kg) BMI (Calculated): 38.63 Weight at Last Visit: 256lb Weight Lost Since Last Visit: 2lb Weight Gained Since Last Visit: 0 Starting Weight: 262lb Total Weight Loss (lbs): 8 lb (3.629 kg)   Body Composition  Body Fat %: 45.6 % Fat Mass (lbs): 115.8 lbs Muscle Mass (lbs): 131.2 lbs Total Body Water (lbs): 98.8 lbs Visceral Fat Rating : 13   Other Clinical Data Fasting: yes Labs: no Today's Visit #: 5 Starting Date: 04/03/22    OBESITY Tracy Ford is here to discuss her progress with her obesity treatment plan along with follow-up of her obesity related diagnoses.     Nutrition Plan: the Category 4 plan - 30-40% adherence.  Current exercise: none  Interim History:  She is down 2 lbs since her last visit.  Eating all of the food on the plan., Protein intake is as prescribed, Is not skipping meals, and Water intake is adequate.  Pharmacotherapy: Tracy Ford is on Wegovy 0.50 mg SQ weekly Adverse side effects: Nausea Hunger is moderately controlled.  Cravings are moderately controlled.  Assessment/Plan:   1. Vitamin D insufficiency Vitamin D is not at goal of 50.  Most recent vitamin D level was 28.5.  She is on  prescription ergocalciferol 50,000 IU weekly. Lab Results  Component Value Date   VD25OH 28.5 (L) 04/03/2022    Plan: Refill prescription vitamin D 50,000 IU weekly.   2. Polyphagia  She is taking Wegovy and tolerating well. She states that she is reaching satiety faster.   Plan: She will continue the [facility].      Generalized Obesity: Current BMI BMI (Calculated): 38.63   Pharmacotherapy Plan Continue and increase dose  Wegovy 0.50 mg SQ weekly  Tracy Ford is currently in the action stage of change. As such, her goal  is to continue with weight loss efforts.  She has agreed to the Category 4 plan.  Exercise goals: For substantial health benefits, adults should do at least 150 minutes (2 hours and 30 minutes) a week of moderate-intensity, or 75 minutes (1 hour and 15 minutes) a week of vigorous-intensity aerobic physical activity, or an equivalent combination of moderate- and vigorous-intensity aerobic activity. Aerobic activity should be performed in episodes of at least 10 minutes, and preferably, it should be spread throughout the week.  Behavioral modification strategies: increasing lean protein intake and meal planning .  Tracy Ford has agreed to follow-up with our clinic in 2 weeks.   No orders of the defined types were placed in this encounter.   There are no discontinued medications.   No orders of the defined types were placed in this encounter.     Objective:   VITALS: Per patient if applicable, see vitals. GENERAL: Alert and in no acute distress. CARDIOPULMONARY: No increased WOB. Speaking in clear sentences.  PSYCH: Pleasant and cooperative. Speech normal rate and rhythm. Affect is appropriate. Insight and judgement are appropriate. Attention is focused, linear, and appropriate.  NEURO: Oriented as arrived to appointment on time with no prompting.   Attestation Statements:    This was prepared with the assistance of Presenter, broadcasting.  Occasional wrong-word or sound-a-like substitutions may have occurred due to the inherent limitations of voice recognition software.  Jearld Lesch, DO

## 2022-06-19 ENCOUNTER — Encounter: Payer: Self-pay | Admitting: Bariatrics

## 2022-06-19 ENCOUNTER — Ambulatory Visit (INDEPENDENT_AMBULATORY_CARE_PROVIDER_SITE_OTHER): Payer: No Typology Code available for payment source | Admitting: Bariatrics

## 2022-06-19 VITALS — BP 122/82 | HR 79 | Temp 98.0°F | Ht 68.0 in | Wt 250.0 lb

## 2022-06-19 DIAGNOSIS — E669 Obesity, unspecified: Secondary | ICD-10-CM

## 2022-06-19 DIAGNOSIS — Z6838 Body mass index (BMI) 38.0-38.9, adult: Secondary | ICD-10-CM

## 2022-06-19 DIAGNOSIS — R632 Polyphagia: Secondary | ICD-10-CM | POA: Diagnosis not present

## 2022-06-19 DIAGNOSIS — R7302 Impaired glucose tolerance (oral): Secondary | ICD-10-CM | POA: Diagnosis not present

## 2022-06-19 DIAGNOSIS — E559 Vitamin D deficiency, unspecified: Secondary | ICD-10-CM | POA: Diagnosis not present

## 2022-06-19 MED ORDER — WEGOVY 0.5 MG/0.5ML ~~LOC~~ SOAJ: 0.5000 mg | SUBCUTANEOUS | 0 refills | Status: DC

## 2022-06-19 MED ORDER — VITAMIN D (ERGOCALCIFEROL) 1.25 MG (50000 UNIT) PO CAPS: 50000.0000 [IU] | ORAL_CAPSULE | ORAL | 0 refills | Status: DC

## 2022-06-19 NOTE — Progress Notes (Signed)
WEIGHT SUMMARY AND BIOMETRICS  Weight Lost Since Last Visit: 4lb  Vitals Temp: 98 F (36.7 C) BP: 122/82 Pulse Rate: 79 SpO2: 98 %   Anthropometric Measurements Height:  (1.727 m) Weight: 250 lb (113.4 kg) BMI (Calculated): 38.02 Weight at Last Visit: 254lb Weight Lost Since Last Visit: 4lb Starting Weight: 262lb Total Weight Loss (lbs): 12 lb (5.443 kg)   Body Composition  Body Fat %: 45.5 % Fat Mass (lbs): 114 lbs Muscle Mass (lbs): 129.8 lbs Total Body Water (lbs): 101.6 lbs Visceral Fat Rating : 12   Other Clinical Data Fasting: no Labs: no Today's Visit #: 6 Starting Date: 04/03/22    OBESITY Tracy Ford is here to discuss her progress with her obesity treatment plan along with follow-up of her obesity related diagnoses.     Nutrition Plan: the Category 4 plan - 60% adherence.  Current exercise: walking and yard work  Interim History:  She is down 4 lbs and doing well overall. She is on a more normal routine.  Eating all of the food on the plan., Protein intake is as prescribed, Meeting calorie goals., and Water intake is adequate.  Pharmacotherapy: Tracy Ford is on Wegovy 0.50 mg SQ weekly Adverse side effects: None Hunger is moderately controlled.  Cravings are well controlled.  Assessment/Plan:   1. Vitamin D insufficiency  Vitamin D is not at goal of 50.  Most recent vitamin D level was 28.5. She is on  prescription ergocalciferol 50,000 IU weekly. Lab Results  Component Value Date   VD25OH 28.5 (L) 04/03/2022    Plan: Refill prescription vitamin D 50,000 IU weekly.   Polyphagia Tracy Ford endorses excessive hunger.  Medication(s): Tracy Ford Effects of medication:   Medication(s): Wegovy 0.50 mg SQ weekly Will increase water, protein and fiber to help assuage hunger.  Will minimize foods that have a high glucose index/load to minimize reactive hypoglycemia.   Impaired glucose tolerance   She is taking Wegovy. She is  cutting her carbohydrates.   Plan:   Continue Tracy Ford.  Will minimize all carbohydrates ( sugar and starch ).    Generalized Obesity: Current BMI BMI (Calculated): 38.02   Pharmacotherapy Plan Continue and refill  Tracy Ford 5.0 mg SQ weekly  Tracy Ford is currently in the action stage of change. As such, her goal is to continue with weight loss efforts.  She has agreed to the Category 4 plan.  Exercise goals: For substantial health benefits, adults should do at least 150 minutes (2 hours and 30 minutes) a week of moderate-intensity, or 75 minutes (1 hour and 15 minutes) a week of vigorous-intensity aerobic physical activity, or an equivalent combination of moderate- and vigorous-intensity aerobic activity. Aerobic activity should be performed in episodes of at least 10 minutes, and preferably, it should be spread throughout the week.  Behavioral modification strategies: meal planning  and planning for success.  Tracy Ford has agreed to follow-up with our clinic in 4 weeks.    Objective:   VITALS: Per patient if applicable, see vitals. GENERAL: Alert and in no acute distress. CARDIOPULMONARY: No increased WOB. Speaking in clear sentences.  PSYCH: Pleasant and cooperative. Speech normal rate and rhythm. Affect is appropriate. Insight and judgement are appropriate. Attention is focused, linear, and appropriate.  NEURO: Oriented as arrived to appointment on time with no prompting.   Attestation Statements:   This was prepared with the assistance of Engineer, civil (consulting).  Occasional wrong-word or sound-a-like substitutions may have occurred due to the inherent limitations of voice recognition  software.   Corinna Capra, DO

## 2022-07-02 ENCOUNTER — Ambulatory Visit (INDEPENDENT_AMBULATORY_CARE_PROVIDER_SITE_OTHER): Payer: No Typology Code available for payment source | Admitting: Bariatrics

## 2022-07-02 ENCOUNTER — Encounter: Payer: Self-pay | Admitting: Bariatrics

## 2022-07-02 VITALS — BP 129/84 | HR 77 | Temp 97.9°F | Ht 68.0 in | Wt 250.0 lb

## 2022-07-02 DIAGNOSIS — R632 Polyphagia: Secondary | ICD-10-CM | POA: Diagnosis not present

## 2022-07-02 DIAGNOSIS — E559 Vitamin D deficiency, unspecified: Secondary | ICD-10-CM

## 2022-07-02 DIAGNOSIS — Z6839 Body mass index (BMI) 39.0-39.9, adult: Secondary | ICD-10-CM

## 2022-07-02 DIAGNOSIS — E669 Obesity, unspecified: Secondary | ICD-10-CM | POA: Diagnosis not present

## 2022-07-02 MED ORDER — VITAMIN D (ERGOCALCIFEROL) 1.25 MG (50000 UNIT) PO CAPS: 50000.0000 [IU] | ORAL_CAPSULE | ORAL | 0 refills | Status: DC

## 2022-07-03 ENCOUNTER — Encounter: Payer: Self-pay | Admitting: Bariatrics

## 2022-07-03 NOTE — Progress Notes (Signed)
Chief Complaint:   OBESITY Tracy Ford is here to discuss her progress with her obesity treatment plan along with follow-up of her obesity related diagnoses. Tracy Ford is on the Category 4 Plan and states she is following her eating plan approximately 70% of the time. Tracy Ford states she is walking for 10 minutes 2-3 times per week.  Today's visit was #: 7 Starting weight: 262 lbs Starting date: 04/03/2022 Today's weight: 250 lbs Today's date: 07/02/2022 Total lbs lost to date: 12 Total lbs lost since last in-office visit: 0  Interim History: Tracy Ford's weight remains the same. She is eating less and eating more vegetables. She is getting more exercise.   Subjective:   1. Polyphagia Tracy Ford weight remains the same. Prescription for [facility].   2. Vitamin D insufficiency Tracy Ford is taking Vitamin D as directed.   Assessment/Plan:   1. Polyphagia We will check on prior authorization for [facility].   2. Vitamin D insufficiency We will refill prescription Vitamin D for 1 month.   - Vitamin D, Ergocalciferol, (DRISDOL) 1.25 MG (50000 UNIT) CAPS capsule; Take 1 capsule (50,000 Units total) by mouth every 7 (seven) days.  Dispense: 5 capsule; Refill: 0  3. Generalized obesity  4. Obesity, starting BMI 39.84 Tracy Ford is currently in the action stage of change. As such, her goal is to continue with weight loss efforts. She has agreed to the Category 3 Plan and keeping a food journal and adhering to recommended goals of 1600 calories and 90-100 protein.   She will adhere closely to the eating plan 85-90%. Meal planning was discussed. Change from Category 4 to Category 3 to 1600 calories 90-100 grams of protein. Fiber handout was given.   Exercise goals: As is.   Behavioral modification strategies: increasing lean protein intake, decreasing simple carbohydrates, increasing vegetables, increasing water intake, decreasing eating out, no skipping meals, meal planning and cooking  strategies, keeping healthy foods in the home, and planning for success.  Tracy Ford has agreed to follow-up with our clinic in 3 weeks. She was informed of the importance of frequent follow-up visits to maximize her success with intensive lifestyle modifications for her multiple health conditions.   Objective:   Blood pressure 129/84, pulse 77, temperature 97.9 F (36.6 C), height 5\' 8"  (1.727 m), weight 250 lb (113.4 kg), SpO2 98 %. Body mass index is 38.01 kg/m.  General: Cooperative, alert, well developed, in no acute distress. HEENT: Conjunctivae and lids unremarkable. Cardiovascular: Regular rhythm.  Lungs: Normal work of breathing. Neurologic: No focal deficits.   Lab Results  Component Value Date   CREATININE 0.77 03/12/2022   BUN 12 03/12/2022   NA 137 03/12/2022   K 4.4 03/12/2022   CL 103 03/12/2022   CO2 18 (L) 03/12/2022   Lab Results  Component Value Date   ALT 37 (H) 03/12/2022   AST 29 03/12/2022   ALKPHOS 67 03/12/2022   BILITOT 0.2 03/12/2022   Lab Results  Component Value Date   HGBA1C 5.1 03/12/2022   HGBA1C 4.5 03/20/2016   HGBA1C 5.0 02/23/2014   HGBA1C 5.0 02/24/2013   Lab Results  Component Value Date   INSULIN 8.9 04/03/2022   Lab Results  Component Value Date   TSH 3.160 03/12/2022   Lab Results  Component Value Date   CHOL 191 03/12/2022   HDL 58 03/12/2022   LDLCALC 107 (H) 03/12/2022   TRIG 147 03/12/2022   CHOLHDL 3.3 03/12/2022   Lab Results  Component Value Date   VD25OH  28.5 (L) 04/03/2022   Lab Results  Component Value Date   WBC 5.0 03/12/2022   HGB 14.0 03/12/2022   HCT 40.2 03/12/2022   MCV 89 03/12/2022   PLT 189 03/12/2022   No results found for: "IRON", "TIBC", "FERRITIN"  Attestation Statements:   Reviewed by clinician on day of visit: allergies, medications, problem list, medical history, surgical history, family history, social history, and previous encounter notes.   Trude Mcburney, am acting as  Energy manager for [employer], DO.  I have reviewed the above documentation for accuracy and completeness, and I agree with the above. Corinna Capra, DO

## 2022-07-04 ENCOUNTER — Telehealth: Payer: Self-pay

## 2022-07-04 NOTE — Telephone Encounter (Signed)
Started prior [place] for [employer] through covermymeds.

## 2022-07-26 ENCOUNTER — Ambulatory Visit (INDEPENDENT_AMBULATORY_CARE_PROVIDER_SITE_OTHER): Payer: No Typology Code available for payment source | Admitting: Bariatrics

## 2022-07-26 ENCOUNTER — Encounter: Payer: Self-pay | Admitting: Bariatrics

## 2022-07-26 DIAGNOSIS — E559 Vitamin D deficiency, unspecified: Secondary | ICD-10-CM | POA: Diagnosis not present

## 2022-07-26 DIAGNOSIS — R7302 Impaired glucose tolerance (oral): Secondary | ICD-10-CM

## 2022-07-26 DIAGNOSIS — R632 Polyphagia: Secondary | ICD-10-CM

## 2022-07-26 DIAGNOSIS — E669 Obesity, unspecified: Secondary | ICD-10-CM

## 2022-07-26 DIAGNOSIS — Z6837 Body mass index (BMI) 37.0-37.9, adult: Secondary | ICD-10-CM

## 2022-07-26 MED ORDER — WEGOVY 1 MG/0.5ML ~~LOC~~ SOAJ: 1.0000 mg | SUBCUTANEOUS | 0 refills | Status: DC

## 2022-07-26 MED ORDER — VITAMIN D (ERGOCALCIFEROL) 1.25 MG (50000 UNIT) PO CAPS: 50000.0000 [IU] | ORAL_CAPSULE | ORAL | 0 refills | Status: DC

## 2022-07-26 NOTE — Progress Notes (Signed)
WEIGHT SUMMARY AND BIOMETRICS  Weight Lost Since Last Visit: 2lb   Vitals Temp: 97.8 F (36.6 C) BP: 124/87 Pulse Rate: 74 SpO2: 97 %   Anthropometric Measurements Height: 5\' 8"  (1.727 m) Weight: 248 lb (112.5 kg) BMI (Calculated): 37.72 Weight at Last Visit: 250lb Weight Lost Since Last Visit: 2lb Weight Gained Since Last Visit: 0 Starting Weight: 262lb Total Weight Loss (lbs): 14 lb (6.35 kg)   Body Composition  Body Fat %: 45.5 % Fat Mass (lbs): 113 lbs Muscle Mass (lbs): 128.4 lbs Total Body Water (lbs): 103 lbs Visceral Fat Rating : 12   Other Clinical Data Fasting: no Labs: no Today's Visit #: 8 Starting Date: 04/03/22    OBESITY Tracy Ford is here to discuss her progress with her obesity treatment plan along with follow-up of her obesity related diagnoses.     Nutrition Plan: the Category 3 plan - 70% adherence.  Current exercise: bicycling  Interim History:  She is down 2 lbs since her last visit.  Protein intake is as prescribed, Meeting calorie goals., Water intake is adequate., and Reports polyphagia  Pharmacotherapy: Tracy Ford is on Wegovy 0.50 mg SQ weekly Adverse side effects: None Hunger is moderately controlled.  Cravings are moderately controlled.  Assessment/Plan:   1. Vitamin D insufficiency  Vitamin D is not at goal of 50.  Most recent vitamin D level was 28.5. She is on  prescription ergocalciferol 50,000 IU weekly. Lab Results  Component Value Date   VD25OH 28.5 (L) 04/03/2022    Plan: Refill prescription vitamin D 50,000 IU weekly.   2. Impaired glucose tolerance  Tracy Ford has had elevated fasting insulin readings. Goal is HgbA1c < 5.7, fasting insulin at l0 or less, and preferably at 5.  She reports  polyphagia. Medication(s): ZOXWRU  Lab Results  Component Value Date   HGBA1C 5.1 03/12/2022   Lab Results  Component Value Date   INSULIN 8.9 04/03/2022    Plan Medication(s): Wegovy 1.0 mg SQ  weekly Will work on the agreed upon plan. Will minimize refined carbohydrates ( sweets and starches), and focus more on complex carbohydrates.  Increase the micronutrients found in leafy greens, which include magnesium, polyphenols, and vitamin C which have been postulated to help with insulin sensitivity. Minimize "fast food" and cook more meals at home.  Increase fiber to 25 to 30 grams daily.  Information sheet on " Insulin Resistance and Prediabetes".    3. Polyphagia  Tracy Ford endorses excessive hunger.  Medication(s): EAVWUJ  Effects of medication:  moderately controlled. Cravings are moderately controlled.   Plan: Medication(s): Wegovy 1.0 mg SQ weekly Will increase water, protein and fiber to help assuage hunger.  Will minimize foods that have a high glucose index/load to minimize reactive hypoglycemia.    Generalized Obesity: Current BMI BMI (Calculated): 37.72   Pharmacotherapy Plan Continue and increase dose  Wegovy 1.0 mg SQ weekly  Tracy Ford is currently in the action stage of change. As such, her goal is to continue with weight loss efforts.  She has agreed to keeping a food journal with goal of 1,600 calories and 90 -100 grams of protein daily.  Exercise goals: For substantial health benefits, adults should do at least 150 minutes (2 hours and 30 minutes) a week of moderate-intensity, or 75 minutes (1 hour and 15 minutes) a week of vigorous-intensity aerobic physical activity, or an equivalent combination of moderate- and vigorous-intensity aerobic activity. Aerobic activity should be performed in episodes of at least 10 minutes, and preferably, it  should be spread throughout the week.  Behavioral modification strategies: meal planning .  Tracy Ford has agreed to follow-up with our clinic in 4 weeks.       Objective:   VITALS: Per patient if applicable, see vitals. GENERAL: Alert and in no acute distress. CARDIOPULMONARY: No increased WOB. Speaking in clear  sentences.  PSYCH: Pleasant and cooperative. Speech normal rate and rhythm. Affect is appropriate. Insight and judgement are appropriate. Attention is focused, linear, and appropriate.  NEURO: Oriented as arrived to appointment on time with no prompting.   Attestation Statements:     This was prepared with the assistance of Engineer, civil (consulting).  Occasional wrong-word or sound-a-like substitutions may have occurred due to the inherent limitations of voice recognition software.   Corinna Capra, DO

## 2022-08-22 ENCOUNTER — Ambulatory Visit (INDEPENDENT_AMBULATORY_CARE_PROVIDER_SITE_OTHER): Payer: No Typology Code available for payment source | Admitting: Bariatrics

## 2022-08-22 ENCOUNTER — Encounter: Payer: Self-pay | Admitting: Bariatrics

## 2022-08-22 VITALS — BP 117/80 | HR 76 | Temp 97.8°F | Ht 68.0 in | Wt 245.0 lb

## 2022-08-22 DIAGNOSIS — E559 Vitamin D deficiency, unspecified: Secondary | ICD-10-CM | POA: Diagnosis not present

## 2022-08-22 DIAGNOSIS — E669 Obesity, unspecified: Secondary | ICD-10-CM | POA: Diagnosis not present

## 2022-08-22 DIAGNOSIS — Z6837 Body mass index (BMI) 37.0-37.9, adult: Secondary | ICD-10-CM

## 2022-08-22 DIAGNOSIS — R632 Polyphagia: Secondary | ICD-10-CM | POA: Diagnosis not present

## 2022-08-22 MED ORDER — VITAMIN D (ERGOCALCIFEROL) 1.25 MG (50000 UNIT) PO CAPS: 50000.0000 [IU] | ORAL_CAPSULE | ORAL | 0 refills | Status: DC

## 2022-08-22 MED ORDER — WEGOVY 1 MG/0.5ML ~~LOC~~ SOAJ: 1.0000 mg | SUBCUTANEOUS | 0 refills | Status: DC

## 2022-08-22 NOTE — Progress Notes (Unsigned)
   WEIGHT SUMMARY AND BIOMETRICS  Weight Lost Since Last Visit: 3lb  Vitals Temp: 97.8 F (36.6 C) BP: 117/80 Pulse Rate: 76 SpO2: 100 %   Anthropometric Measurements Height: 5\' 8"  (1.727 m) Weight: 245 lb (111.1 kg) BMI (Calculated): 37.26 Weight at Last Visit: 248lb Weight Lost Since Last Visit: 3lb Starting Weight: 262lb Total Weight Loss (lbs): 17 lb (7.711 kg)   Body Composition  Body Fat %: 27.4 % Fat Mass (lbs): 67.2 lbs Muscle Mass (lbs): 169.2 lbs Total Body Water (lbs): 114.4 lbs Visceral Fat Rating : 7   Other Clinical Data Fasting: no Labs: no Today's Visit #: 9 Starting Date: 04/03/22    OBESITY Francely is here to discuss her progress with her obesity treatment plan along with follow-up of her obesity related diagnoses.     Nutrition Plan: the Category 3 plan - 70% adherence.  Current exercise: none  Interim History:  She is down 3 lbs since her last visit.  Eating all of the food on the plan., Protein intake is as prescribed, Is not skipping meals, and Water intake is adequate.  Pharmacotherapy: Camilla is on Wegovy 1.0 mg SQ weekly Adverse side effects: Nausea, occasional on the first day of injection.  Hunger is moderately controlled.  Cravings are moderately controlled.  Assessment/Plan:   1. Vitamin D insufficiency Vitamin D Deficiency Vitamin D is not at goal of 50.  Most recent vitamin D level was 28.5. She is on  prescription ergocalciferol 50,000 IU weekly. Lab Results  Component Value Date   VD25OH 28.5 (L) 04/03/2022    Plan: Refill prescription vitamin D 50,000 IU weekly.   Polyphagia Abigayil endorses excessive hunger.  Medication(s): MVHQIO Effects of medication:  moderately controlled. Cravings are moderately controlled.   Plan: Medication(s): Wegovy 1.0 mg SQ weekly Will increase water, protein and fiber to help assuage hunger.  Will minimize foods that have a high glucose index/load to minimize  reactive hypoglycemia.    Generalized Obesity: Current BMI BMI (Calculated): 37.26   Pharmacotherapy Plan {dwwmed:29123}  {dwwpharmacotherapy:29109}  Poorvi is currently in the action stage of change. As such, her goal is to continue with weight loss efforts.  She has agreed to the Category 3 plan.  Exercise goals: All adults should avoid inactivity. Some physical activity is better than none, and adults who participate in any amount of physical activity gain some health benefits.  Behavioral modification strategies: increasing lean protein intake, meal planning , and mindful eating. She will start to do more exercise.   Druanne has agreed to follow-up with our clinic in 4 weeks.      Objective:   VITALS: Per patient if applicable, see vitals. GENERAL: Alert and in no acute distress. CARDIOPULMONARY: No increased WOB. Speaking in clear sentences.  PSYCH: Pleasant and cooperative. Speech normal rate and rhythm. Affect is appropriate. Insight and judgement are appropriate. Attention is focused, linear, and appropriate.  NEURO: Oriented as arrived to appointment on time with no prompting.   Attestation Statements:   This was prepared with the assistance of Engineer, civil (consulting).  Occasional wrong-word or sound-a-like substitutions may have occurred due to the inherent limitations of voice recognition software.   Corinna Capra, DO

## 2022-09-19 ENCOUNTER — Ambulatory Visit (INDEPENDENT_AMBULATORY_CARE_PROVIDER_SITE_OTHER): Payer: No Typology Code available for payment source | Admitting: Bariatrics

## 2022-09-19 ENCOUNTER — Encounter: Payer: Self-pay | Admitting: Bariatrics

## 2022-09-19 VITALS — BP 123/84 | HR 71 | Temp 97.9°F | Ht 68.0 in | Wt 241.0 lb

## 2022-09-19 DIAGNOSIS — E559 Vitamin D deficiency, unspecified: Secondary | ICD-10-CM | POA: Diagnosis not present

## 2022-09-19 DIAGNOSIS — E669 Obesity, unspecified: Secondary | ICD-10-CM

## 2022-09-19 DIAGNOSIS — Z6836 Body mass index (BMI) 36.0-36.9, adult: Secondary | ICD-10-CM

## 2022-09-19 DIAGNOSIS — R632 Polyphagia: Secondary | ICD-10-CM

## 2022-09-19 MED ORDER — WEGOVY 1.7 MG/0.75ML ~~LOC~~ SOAJ: 1.7000 mg | SUBCUTANEOUS | 0 refills | Status: DC

## 2022-09-19 MED ORDER — VITAMIN D (ERGOCALCIFEROL) 1.25 MG (50000 UNIT) PO CAPS
50000.0000 [IU] | ORAL_CAPSULE | ORAL | 0 refills | Status: DC
Start: 2022-09-19 — End: 2022-11-20

## 2022-09-19 NOTE — Progress Notes (Signed)
   WEIGHT SUMMARY AND BIOMETRICS  Weight Lost Since Last Visit: 4lb   Vitals Temp: 97.9 F (36.6 C) BP: 123/84 Pulse Rate: 71 SpO2: 99 %   Anthropometric Measurements Height: 5\' 8"  (1.727 m) Weight: 241 lb (109.3 kg) BMI (Calculated): 36.65 Weight at Last Visit: 245lb Weight Lost Since Last Visit: 4lb Starting Weight: 262lb Total Weight Loss (lbs): 21 lb (9.526 kg)   Body Composition  Body Fat %: 24.6 % Fat Mass (lbs): 59.4 lbs Muscle Mass (lbs): 173 lbs Total Body Water (lbs): 116.8 lbs Visceral Fat Rating : 7   Other Clinical Data Fasting: no Labs: no Today's Visit #: 10 Starting Date: 04/03/22    OBESITY Tracy Ford is here to discuss her progress with her obesity treatment plan along with follow-up of her obesity related diagnoses.     Nutrition Plan: the Category 3 plan - 45% adherence.  Current exercise: bicycling, hiking, and walking  Interim History:  She has been on vacation, but has managed to lose 4 lbs since her last visit.  Eating all of the food on the plan., Protein intake is as prescribed, and Water intake is adequate.  Pharmacotherapy: Tracy Ford is on Wegovy 1.0 mg SQ weekly Adverse side effects: None Hunger is moderately controlled.  Cravings are moderately controlled.  Assessment/Plan:   1. Vitamin D insufficiency Vitamin D Deficiency Vitamin D is not at goal of 50.  Most recent vitamin D level was 28.5. She is on  prescription ergocalciferol 50,000 IU weekly. Lab Results  Component Value Date   VD25OH 28.5 (L) 04/03/2022    Plan: Refill prescription vitamin D 50,000 IU weekly.   2 Polyphagia Tracy Ford endorses excessive hunger.  Medication(s): ZOXWRU Effects of medication:  moderately controlled. Cravings are moderately controlled.   Plan: Medication(s): Wegovy 1.7 SQ weekly ( increase from 1 mg to 1.7 mg ).  Will increase water, protein and fiber to help assuage hunger.  Will minimize foods that have a high  glucose index/load to minimize reactive hypoglycemia.     Generalized Obesity: Current BMI BMI (Calculated): 36.65   Pharmacotherapy Plan Continue and increase dose  Wegovy 1.7 SQ weekly  Tracy Ford is currently in the action stage of change. As such, her goal is to continue with weight loss efforts.  She has agreed to the Category 3 plan.  Exercise goals: All adults should avoid inactivity. Some physical activity is better than none, and adults who participate in any amount of physical activity gain some health benefits.  Behavioral modification strategies: increasing lean protein intake, decrease eating out, meal planning , increasing fiber rich foods, keep healthy foods in the home, and mindful eating.  Tracy Ford has agreed to follow-up with our clinic in 4 weeks.       Objective:   VITALS: Per patient if applicable, see vitals. GENERAL: Alert and in no acute distress. CARDIOPULMONARY: No increased WOB. Speaking in clear sentences.  PSYCH: Pleasant and cooperative. Speech normal rate and rhythm. Affect is appropriate. Insight and judgement are appropriate. Attention is focused, linear, and appropriate.  NEURO: Oriented as arrived to appointment on time with no prompting.   Attestation Statements:   This was prepared with the assistance of Engineer, civil (consulting).  Occasional wrong-word or sound-a-like substitutions may have occurred due to the inherent limitations of voice recognition software.   Corinna Capra, DO

## 2022-10-19 ENCOUNTER — Other Ambulatory Visit: Payer: Self-pay | Admitting: Bariatrics

## 2022-10-22 MED ORDER — WEGOVY 1.7 MG/0.75ML ~~LOC~~ SOAJ: 1.7000 mg | SUBCUTANEOUS | 0 refills | Status: DC

## 2022-10-30 ENCOUNTER — Ambulatory Visit (INDEPENDENT_AMBULATORY_CARE_PROVIDER_SITE_OTHER): Payer: No Typology Code available for payment source | Admitting: Bariatrics

## 2022-10-30 ENCOUNTER — Encounter: Payer: Self-pay | Admitting: Bariatrics

## 2022-10-30 VITALS — BP 119/80 | HR 77 | Temp 98.2°F | Ht 68.0 in | Wt 235.0 lb

## 2022-10-30 DIAGNOSIS — E669 Obesity, unspecified: Secondary | ICD-10-CM

## 2022-10-30 DIAGNOSIS — E559 Vitamin D deficiency, unspecified: Secondary | ICD-10-CM

## 2022-10-30 DIAGNOSIS — Z6835 Body mass index (BMI) 35.0-35.9, adult: Secondary | ICD-10-CM

## 2022-10-30 NOTE — Progress Notes (Signed)
   WEIGHT SUMMARY AND BIOMETRICS  Weight Lost Since Last Visit: 6lb  Vitals Temp: 98.2 F (36.8 C) BP: 119/80 Pulse Rate: 77 SpO2: 99 %   Anthropometric Measurements Height: 5\' 8"  (1.727 m) Weight: 235 lb (106.6 kg) BMI (Calculated): 35.74 Weight at Last Visit: 241lb Weight Lost Since Last Visit: 6lb Starting Weight: 262lb Total Weight Loss (lbs): 27 lb (12.2 kg)   Body Composition  Body Fat %: 43 % Fat Mass (lbs): 101 lbs Muscle Mass (lbs): 127.2 lbs Total Body Water (lbs): 97 lbs Visceral Fat Rating : 11   Other Clinical Data Fasting: no Labs: no Today's Visit #: 11 Starting Date: 04/03/22    OBESITY Tracy Ford is here to discuss her progress with her obesity treatment plan along with follow-up of her obesity related diagnoses.     Nutrition Plan: the Category 3 plan - 70% adherence.  Current exercise: none  Interim History:  She is down 6 lbs since her last visit. According to the bioimpedance machine, her body fat percentage is down and her muscle mass is down slightly.  Eating all of the food on the plan., Protein intake is as prescribed, Is not drinking sugar sweetened beverages., Is not skipping meals, Meeting protein goals., and Water intake is adequate.  Pharmacotherapy: Tracy Ford is on Wegovy 1.7 SQ weekly Adverse side effects: None Hunger is moderately controlled.  Cravings are moderately controlled.  Assessment/Plan:   1. Vitamin D insufficiency Vitamin D Deficiency Vitamin D is not at goal of 50.  Most recent vitamin D level was 28.5. She is on  prescription ergocalciferol 50,000 IU weekly. Lab Results  Component Value Date   VD25OH 28.5 (L) 04/03/2022    Plan: Continue prescription vitamin D 50,000 IU weekly.      Generalized Obesity: Current BMI BMI (Calculated): 35.74   Pharmacotherapy Plan Continue  Wegovy 1.7 SQ weekly  Tracy Ford is currently in the action stage of change. As such, her goal is to continue with weight  loss efforts.  She has agreed to the Category 3 plan.  Exercise goals: For substantial health benefits, adults should do at least 150 minutes (2 hours and 30 minutes) a week of moderate-intensity, or 75 minutes (1 hour and 15 minutes) a week of vigorous-intensity aerobic physical activity, or an equivalent combination of moderate- and vigorous-intensity aerobic activity. Aerobic activity should be performed in episodes of at least 10 minutes, and preferably, it should be spread throughout the week.  Behavioral modification strategies: decreasing simple carbohydrates , no meal skipping, decrease eating out, meal planning , increase water intake, better snacking choices, planning for success, increasing vegetables, and mindful eating.  Tracy Ford has agreed to follow-up with our clinic in 3 weeks.        Objective:   VITALS: Per patient if applicable, see vitals. GENERAL: Alert and in no acute distress. CARDIOPULMONARY: No increased WOB. Speaking in clear sentences.  PSYCH: Pleasant and cooperative. Speech normal rate and rhythm. Affect is appropriate. Insight and judgement are appropriate. Attention is focused, linear, and appropriate.  NEURO: Oriented as arrived to appointment on time with no prompting.   Attestation Statements:    This was prepared with the assistance of Engineer, civil (consulting).  Occasional wrong-word or sound-a-like substitutions may have occurred due to the inherent limitations of voice recognition software.   Corinna Capra, DO

## 2022-11-20 ENCOUNTER — Ambulatory Visit: Payer: No Typology Code available for payment source | Admitting: Nurse Practitioner

## 2022-11-20 ENCOUNTER — Encounter: Payer: Self-pay | Admitting: Nurse Practitioner

## 2022-11-20 VITALS — BP 133/90 | HR 74 | Temp 98.1°F | Ht 68.0 in | Wt 233.0 lb

## 2022-11-20 DIAGNOSIS — E559 Vitamin D deficiency, unspecified: Secondary | ICD-10-CM

## 2022-11-20 DIAGNOSIS — E669 Obesity, unspecified: Secondary | ICD-10-CM

## 2022-11-20 DIAGNOSIS — Z79899 Other long term (current) drug therapy: Secondary | ICD-10-CM

## 2022-11-20 DIAGNOSIS — Z6835 Body mass index (BMI) 35.0-35.9, adult: Secondary | ICD-10-CM

## 2022-11-20 MED ORDER — VITAMIN D (ERGOCALCIFEROL) 1.25 MG (50000 UNIT) PO CAPS: 50000.0000 [IU] | ORAL_CAPSULE | ORAL | 0 refills | Status: DC

## 2022-11-20 MED ORDER — WEGOVY 1.7 MG/0.75ML ~~LOC~~ SOAJ
1.7000 mg | SUBCUTANEOUS | 0 refills | Status: DC
Start: 2022-11-20 — End: 2022-12-20

## 2022-11-20 NOTE — Patient Instructions (Signed)

## 2022-11-20 NOTE — Progress Notes (Signed)
Office: [phone]  /  Fax: [phone]  WEIGHT SUMMARY AND BIOMETRICS  Weight Lost Since Last Visit: 2lb  Weight Gained Since Last Visit: 0lb   Vitals Temp: 98.1 F (36.7 C) BP: (!) 133/90 Pulse Rate: 74 SpO2: 97 %   Anthropometric Measurements Height: 5\' 8"  (1.727 m) Weight: 233 lb (105.7 kg) BMI (Calculated): 35.44 Weight at Last Visit: 235lb Weight Lost Since Last Visit: 2lb Weight Gained Since Last Visit: 0lb Starting Weight: 262lb Total Weight Loss (lbs): 29 lb (13.2 kg)   Body Composition  Body Fat %: 42.6 % Fat Mass (lbs): 99.6 lbs Muscle Mass (lbs): 127.2 lbs Total Body Water (lbs): 96.2 lbs Visceral Fat Rating : 11   Other Clinical Data Fasting: No Labs: No Today's Visit #: 12 Starting Date: 04/03/22     HPI  Chief Complaint: OBESITY  Tracy Ford is here to discuss her progress with her obesity treatment plan. She is on the the Category 3 Plan and states she is following her eating plan approximately 60 % of the time. She states she is exercising 0 minutes 0 days per week.   Interval History:  Since last office visit she has lost 2 pounds.  She goes through phases where she feels full and then weeks when she doesn't feel full.  Denies cravings.  She is drinking water and coffee daily.  Drinks wine on the weekends-3 glasses.    Pharmacotherapy for weight loss: She is currently taking Wegovy 1.7mg  for medical weight loss.  Denies side effects.   PA approved from 07/09/22-07/07/23   Previous pharmacotherapy for medical weight loss:  None  Bariatric surgery:  Patient has not had bariatric surgery.  Vit D deficiency  She is taking Vit D 50,000 IU weekly.  Denies side effects.  Denies nausea, vomiting or muscle weakness.    Lab Results  Component Value Date   VD25OH 28.5 (L) 04/03/2022     PHYSICAL EXAM:  Blood pressure (!) 133/90, pulse 74, temperature 98.1 F (36.7 C), height 5\' 8"  (1.727 m), weight 233 lb (105.7 kg), last menstrual  period 11/06/2022, SpO2 97%. Body mass index is 35.43 kg/m.  General: She is overweight, cooperative, alert, well developed, and in no acute distress. PSYCH: Has normal mood, affect and thought process.   Extremities: No edema.  Neurologic: No gross sensory or motor deficits. No tremors or fasciculations noted.    DIAGNOSTIC DATA REVIEWED:  BMET    Component Value Date/Time   NA 137 03/12/2022 0920   K 4.4 03/12/2022 0920   CL 103 03/12/2022 0920   CO2 18 (L) 03/12/2022 0920   GLUCOSE 90 03/12/2022 0920   GLUCOSE 91 03/20/2016 0925   BUN 12 03/12/2022 0920   CREATININE 0.77 03/12/2022 0920   CREATININE 0.68 03/20/2016 0925   CALCIUM 9.0 03/12/2022 0920   GFRNONAA >89 03/20/2016 0925   GFRAA >89 03/20/2016 0925   Lab Results  Component Value Date   HGBA1C 5.1 03/12/2022   HGBA1C 5.0 02/24/2013   Lab Results  Component Value Date   INSULIN 8.9 04/03/2022   Lab Results  Component Value Date   TSH 3.160 03/12/2022   CBC    Component Value Date/Time   WBC 5.0 03/12/2022 0920   RBC 4.51 03/12/2022 0920   HGB 14.0 03/12/2022 0920   HCT 40.2 03/12/2022 0920   PLT 189 03/12/2022 0920   MCV 89 03/12/2022 0920   MCH 31.0 03/12/2022 0920   MCHC 34.8 03/12/2022 0920   RDW 12.9 03/12/2022  4098   Iron Studies No results found for: "IRON", "TIBC", "FERRITIN", "IRONPCTSAT" Lipid Panel     Component Value Date/Time   CHOL 191 03/12/2022 0920   TRIG 147 03/12/2022 0920   HDL 58 03/12/2022 0920   CHOLHDL 3.3 03/12/2022 0920   CHOLHDL 2.6 03/20/2016 0925   VLDL 20 03/20/2016 0925   LDLCALC 107 (H) 03/12/2022 0920   Hepatic Function Panel     Component Value Date/Time   PROT 6.7 03/12/2022 0920   ALBUMIN 4.4 03/12/2022 0920   AST 29 03/12/2022 0920   ALT 37 (H) 03/12/2022 0920   ALKPHOS 67 03/12/2022 0920   BILITOT 0.2 03/12/2022 0920      Component Value Date/Time   TSH 3.160 03/12/2022 0920   Nutritional Lab Results  Component Value Date   VD25OH 28.5  (L) 04/03/2022     ASSESSMENT AND PLAN  TREATMENT PLAN FOR OBESITY:  Recommended Dietary Goals  Tracy Ford is currently in the action stage of change. As such, her goal is to continue weight management plan. She has agreed to the Category 3 Plan-+100-200 protein snack with hunger.  Behavioral Intervention  We discussed the following Behavioral Modification Strategies today: increasing lean protein intake, decreasing simple carbohydrates , increasing vegetables, increasing lower glycemic fruits, increasing water intake, work on meal planning and preparation, reading food labels , keeping healthy foods at home, continue to practice mindfulness when eating, and planning for success.  Additional resources provided today: NA  Recommended Physical Activity Goals  Tracy Ford has been advised to work up to 150 minutes of moderate intensity aerobic activity a week and strengthening exercises 2-3 times per week for cardiovascular health, weight loss maintenance and preservation of muscle mass.   She has agreed to Think about ways to increase daily physical activity and overcoming barriers to exercise, Increase physical activity in their day and reduce sedentary time (increase NEAT)., and Work on scheduling and tracking physical activity.    Pharmacotherapy We discussed various medication options to help Prem with her weight loss efforts and we both agreed to continue [facility] 1.7mg .    ASSOCIATED CONDITIONS ADDRESSED TODAY  Action/Plan  Vitamin D deficiency -     VITAMIN D 25 Hydroxy (Vit-D Deficiency, Fractures) -     Vitamin D (Ergocalciferol); Take 1 capsule (50,000 Units total) by mouth every 7 (seven) days.  Dispense: 5 capsule; Refill: 0  Low Vitamin D level contributes to fatigue and are associated with obesity, breast, and colon cancer. She agrees to continue to take prescription Vitamin D @50 ,000 IU every week and will follow-up for routine testing of Vitamin D, at least 2-3 times  per year to avoid over-replacement.   Medication management -     Comprehensive metabolic panel  Generalized obesity -     Wegovy; Inject 1.7 mg into the skin once a week.  Dispense: 3 mL; Refill: 0  BMI 35.0-35.9,adult -     [id number]; Inject 1.7 mg into the skin once a week.  Dispense: 3 mL; Refill: 0      Last labs showed an elevated ALT.  Labs obtained today.  Will monitor.     Return in about 4 weeks (around 12/18/2022).Marland Kitchen She was informed of the importance of frequent follow up visits to maximize her success with intensive lifestyle modifications for her multiple health conditions.   ATTESTASTION STATEMENTS:  Reviewed by clinician on day of visit: allergies, medications, problem list, medical history, surgical history, family history, social history, and previous encounter notes.  Theodis Sato. Stiven Kaspar FNP-C

## 2022-12-20 ENCOUNTER — Encounter: Payer: Self-pay | Admitting: Nurse Practitioner

## 2022-12-20 ENCOUNTER — Ambulatory Visit: Payer: No Typology Code available for payment source | Admitting: Nurse Practitioner

## 2022-12-20 VITALS — BP 129/84 | HR 75 | Temp 98.1°F | Ht 68.0 in | Wt 231.0 lb

## 2022-12-20 DIAGNOSIS — R632 Polyphagia: Secondary | ICD-10-CM | POA: Diagnosis not present

## 2022-12-20 DIAGNOSIS — E669 Obesity, unspecified: Secondary | ICD-10-CM | POA: Diagnosis not present

## 2022-12-20 DIAGNOSIS — E559 Vitamin D deficiency, unspecified: Secondary | ICD-10-CM | POA: Diagnosis not present

## 2022-12-20 DIAGNOSIS — Z6835 Body mass index (BMI) 35.0-35.9, adult: Secondary | ICD-10-CM

## 2022-12-20 MED ORDER — VITAMIN D (ERGOCALCIFEROL) 1.25 MG (50000 UNIT) PO CAPS: 50000.0000 [IU] | ORAL_CAPSULE | ORAL | 0 refills | Status: DC

## 2022-12-20 MED ORDER — WEGOVY 1.7 MG/0.75ML ~~LOC~~ SOAJ
1.7000 mg | SUBCUTANEOUS | 0 refills | Status: DC
Start: 2022-12-20 — End: 2023-01-22

## 2022-12-20 NOTE — Progress Notes (Signed)
Office: [phone]  /  Fax: [phone]  WEIGHT SUMMARY AND BIOMETRICS  Weight Lost Since Last Visit: 2lb  Weight Gained Since Last Visit: 0lb   Vitals Temp: 98.1 F (36.7 C) BP: 129/84 Pulse Rate: 75 SpO2: 100 %   Anthropometric Measurements Height: 5\' 8"  (1.727 m) Weight: 231 lb (104.8 kg) BMI (Calculated): 35.13 Weight at Last Visit: 233lb Weight Lost Since Last Visit: 2lb Weight Gained Since Last Visit: 0lb Starting Weight: 262lb Total Weight Loss (lbs): 31 lb (14.1 kg)   Body Composition  Body Fat %: 43.1 % Fat Mass (lbs): 100 lbs Muscle Mass (lbs): 125.2 lbs Total Body Water (lbs): 94.8 lbs Visceral Fat Rating : 11   Other Clinical Data Fasting: No Labs: No Today's Visit #: 13 Starting Date: 04/03/22     HPI  Chief Complaint: OBESITY  Tracy Ford is here to discuss her progress with her obesity treatment plan. She is on the the Category 3 Plan and states she is following her eating plan approximately 60 % of the time. She states she is exercising 0 minutes 0 days per week.   Interval History:  Since last office visit she has lost 2 pounds. Notes increase in polyphagia since her last visit. Feels like she can eat more.  Doesn't feel as full as she has in the past.   She is not skipping meals.  She is eating 2 snacks per day.  She feels she is meeting calories and protein goals.    BF:  yogurt (80) or egg on sara lee bread (160)- water and coffee Snack:  sargento balance breaks-water (190) Lunch:  frozen meal with a fruit-water (200-300 + 95) Snack:  string cheese and/or beef jerky (50 +/- 80) Dinner:  protein, vegetable and carb (400+)    Pharmacotherapy for weight loss: She is currently taking Wegovy 1.7mg  for medical weight loss.  Denies side effects.    PA approved from 07/09/22-07/07/23   Previous pharmacotherapy for medical weight loss:  None  Bariatric surgery:  Patient has not had bariatric surgery.     PHYSICAL EXAM:  Blood  pressure 129/84, pulse 75, temperature 98.1 F (36.7 C), height 5\' 8"  (1.727 m), weight 231 lb (104.8 kg), last menstrual period 12/06/2022, SpO2 100%. Body mass index is 35.12 kg/m.  General: She is overweight, cooperative, alert, well developed, and in no acute distress. PSYCH: Has normal mood, affect and thought process.   Extremities: No edema.  Neurologic: No gross sensory or motor deficits. No tremors or fasciculations noted.    DIAGNOSTIC DATA REVIEWED:  BMET    Component Value Date/Time   NA 137 11/20/2022 1234   K 4.2 11/20/2022 1234   CL 102 11/20/2022 1234   CO2 21 11/20/2022 1234   GLUCOSE 104 (H) 11/20/2022 1234   GLUCOSE 91 03/20/2016 0925   BUN 9 11/20/2022 1234   CREATININE 0.70 11/20/2022 1234   CREATININE 0.68 03/20/2016 0925   CALCIUM 9.3 11/20/2022 1234   GFRNONAA >89 03/20/2016 0925   GFRAA >89 03/20/2016 0925   Lab Results  Component Value Date   HGBA1C 5.1 03/12/2022   HGBA1C 5.0 02/24/2013   Lab Results  Component Value Date   INSULIN 8.9 04/03/2022   Lab Results  Component Value Date   TSH 3.160 03/12/2022   CBC    Component Value Date/Time   WBC 5.0 03/12/2022 0920   RBC 4.51 03/12/2022 0920   HGB 14.0 03/12/2022 0920   HCT 40.2 03/12/2022 0920   PLT 189  03/12/2022 0920   MCV 89 03/12/2022 0920   MCH 31.0 03/12/2022 0920   MCHC 34.8 03/12/2022 0920   RDW 12.9 03/12/2022 0920   Iron Studies No results found for: "IRON", "TIBC", "FERRITIN", "IRONPCTSAT" Lipid Panel     Component Value Date/Time   CHOL 191 03/12/2022 0920   TRIG 147 03/12/2022 0920   HDL 58 03/12/2022 0920   CHOLHDL 3.3 03/12/2022 0920   CHOLHDL 2.6 03/20/2016 0925   VLDL 20 03/20/2016 0925   LDLCALC 107 (H) 03/12/2022 0920   Hepatic Function Panel     Component Value Date/Time   PROT 7.0 11/20/2022 1234   ALBUMIN 4.1 11/20/2022 1234   AST 17 11/20/2022 1234   ALT 30 11/20/2022 1234   ALKPHOS 60 11/20/2022 1234   BILITOT 0.4 11/20/2022 1234       Component Value Date/Time   TSH 3.160 03/12/2022 0920   Nutritional Lab Results  Component Value Date   VD25OH 58.6 11/20/2022   VD25OH 28.5 (L) 04/03/2022     ASSESSMENT AND PLAN  TREATMENT PLAN FOR OBESITY:  Recommended Dietary Goals  Tracy Ford is currently in the action stage of change. As such, her goal is to continue weight management plan. She has agreed to the Category 3 Plan.  Tracy Ford is not eating enough calories and protein.    Behavioral Intervention  We discussed the following Behavioral Modification Strategies today: work on tracking and journaling calories using tracking application and continue to work on maintaining a reduced calorie state, getting the recommended amount of protein, incorporating whole foods, making healthy choices, staying well hydrated and practicing mindfulness when eating..  Additional resources provided today: NA  Recommended Physical Activity Goals  Tracy Ford has been advised to work up to 150 minutes of moderate intensity aerobic activity a week and strengthening exercises 2-3 times per week for cardiovascular health, weight loss maintenance and preservation of muscle mass.   She has agreed to Think about enjoyable ways to increase daily physical activity and overcoming barriers to exercise, Increase physical activity in their day and reduce sedentary time (increase NEAT)., and Work on scheduling and tracking physical activity.    Pharmacotherapy We discussed various medication options to help Tracy Ford with her weight loss efforts and we both agreed to continue [facility] 1.7mg .  ASSOCIATED CONDITIONS ADDRESSED TODAY  Action/Plan  Vitamin D deficiency -     Vitamin D (Ergocalciferol); Take 1 capsule (50,000 Units total) by mouth every 14 (fourteen) days.  Dispense: 6 capsule; Refill: 0.  Side effects discussed  Will continue to monitor   Polyphagia Continue Wegovy 1.7mg   Patient is not eating enough calories and protein.  Discussed food  ideas/options to increase calories/protein intake.  Handouts given on protein snacks.  Generalized obesity -     [id number]; Inject 1.7 mg into the skin once a week.  Dispense: 3 mL; Refill: 0  BMI 35.0-35.9,adult -     ZOXWRU; Inject 1.7 mg into the skin once a week.  Dispense: 3 mL; Refill: 0      Labs reviewed in chart with patient from 11/20/22   Return in about 4 weeks (around 01/17/2023).Marland Kitchen She was informed of the importance of frequent follow up visits to maximize her success with intensive lifestyle modifications for her multiple health conditions.   ATTESTASTION STATEMENTS:  Reviewed by clinician on day of visit: allergies, medications, problem list, medical history, surgical history, family history, social history, and previous encounter notes.     Tracy Ford. Emmalena Canny FNP-C

## 2022-12-24 ENCOUNTER — Other Ambulatory Visit: Payer: Self-pay | Admitting: Family Medicine

## 2022-12-24 DIAGNOSIS — Z1231 Encounter for screening mammogram for malignant neoplasm of breast: Secondary | ICD-10-CM

## 2022-12-27 ENCOUNTER — Ambulatory Visit: Payer: No Typology Code available for payment source

## 2022-12-27 DIAGNOSIS — Z1231 Encounter for screening mammogram for malignant neoplasm of breast: Secondary | ICD-10-CM | POA: Diagnosis not present

## 2022-12-31 ENCOUNTER — Other Ambulatory Visit: Payer: Self-pay | Admitting: Family Medicine

## 2022-12-31 DIAGNOSIS — R928 Other abnormal and inconclusive findings on diagnostic imaging of breast: Secondary | ICD-10-CM

## 2023-01-10 ENCOUNTER — Ambulatory Visit
Admission: RE | Admit: 2023-01-10 | Discharge: 2023-01-10 | Disposition: A | Discharge status: Home / Self Care | Payer: No Typology Code available for payment source | Source: Ambulatory Visit | Attending: Family Medicine | Admitting: Family Medicine

## 2023-01-10 DIAGNOSIS — R928 Other abnormal and inconclusive findings on diagnostic imaging of breast: Secondary | ICD-10-CM

## 2023-01-11 ENCOUNTER — Other Ambulatory Visit: Payer: Self-pay | Admitting: Family Medicine

## 2023-01-11 DIAGNOSIS — R921 Mammographic calcification found on diagnostic imaging of breast: Secondary | ICD-10-CM

## 2023-01-21 ENCOUNTER — Ambulatory Visit
Admission: RE | Admit: 2023-01-21 | Discharge: 2023-01-21 | Disposition: A | Discharge status: Home / Self Care | Payer: No Typology Code available for payment source | Source: Ambulatory Visit | Attending: Family Medicine | Admitting: Family Medicine

## 2023-01-21 ENCOUNTER — Ambulatory Visit: Payer: No Typology Code available for payment source | Admitting: Nurse Practitioner

## 2023-01-21 DIAGNOSIS — R921 Mammographic calcification found on diagnostic imaging of breast: Secondary | ICD-10-CM

## 2023-01-21 HISTORY — PX: BREAST BIOPSY: SHX20

## 2023-01-22 ENCOUNTER — Encounter: Payer: Self-pay | Admitting: Nurse Practitioner

## 2023-01-22 ENCOUNTER — Ambulatory Visit (INDEPENDENT_AMBULATORY_CARE_PROVIDER_SITE_OTHER): Payer: No Typology Code available for payment source | Admitting: Nurse Practitioner

## 2023-01-22 VITALS — BP 123/87 | HR 74 | Temp 98.1°F | Ht 68.0 in | Wt 230.0 lb

## 2023-01-22 DIAGNOSIS — E559 Vitamin D deficiency, unspecified: Secondary | ICD-10-CM

## 2023-01-22 DIAGNOSIS — Z6834 Body mass index (BMI) 34.0-34.9, adult: Secondary | ICD-10-CM | POA: Diagnosis not present

## 2023-01-22 DIAGNOSIS — E669 Obesity, unspecified: Secondary | ICD-10-CM | POA: Diagnosis not present

## 2023-01-22 DIAGNOSIS — R632 Polyphagia: Secondary | ICD-10-CM | POA: Diagnosis not present

## 2023-01-22 LAB — SURGICAL PATHOLOGY

## 2023-01-22 MED ORDER — VITAMIN D (ERGOCALCIFEROL) 1.25 MG (50000 UNIT) PO CAPS: 50000.0000 [IU] | ORAL_CAPSULE | ORAL | 0 refills | Status: DC

## 2023-01-22 MED ORDER — WEGOVY 2.4 MG/0.75ML ~~LOC~~ SOAJ: 2.4000 mg | SUBCUTANEOUS | 0 refills | Status: DC

## 2023-01-22 NOTE — Progress Notes (Signed)
Office: [phone]  /  Fax: [phone]  WEIGHT SUMMARY AND BIOMETRICS  Weight Lost Since Last Visit: 1lb  Weight Gained Since Last Visit: 0lb   Vitals Temp: 98.1 F (36.7 C) BP: 123/87 Pulse Rate: 74 SpO2: 100 %   Anthropometric Measurements Height: 5\' 8"  (1.727 m) Weight: 230 lb (104.3 kg) BMI (Calculated): 34.98 Weight at Last Visit: 231lb Weight Lost Since Last Visit: 1lb Weight Gained Since Last Visit: 0lb Starting Weight: 262lb Total Weight Loss (lbs): 32 lb (14.5 kg)   Body Composition  Body Fat %: 42.7 % Fat Mass (lbs): 98.4 lbs Muscle Mass (lbs): 125.6 lbs Total Body Water (lbs): 98.6 lbs Visceral Fat Rating : 11   Other Clinical Data Fasting: No Labs: No Today's Visit #: 14 Starting Date: 04/03/22     HPI  Chief Complaint: OBESITY  Tracy Ford is here to discuss her progress with her obesity treatment plan. She is on the the Category 3 Plan and states she is following her eating plan approximately 50 % of the time. She states she is exercising 0 minutes 0 days per week.   Interval History:  Since last office visit she has lost 1 pound.  She has overall done well with weight loss.  She is asking to increase Wegovy dose.  Finds she is struggling with polyphagia/cravings.  She went out of town for a conference since her last visit.  She had an abnormal mammogram and had a biopsy yesterday.    Pharmacotherapy for weight loss: She is currently taking Wegovy 1.7mg  for medical weight loss.  Denies side effects.    PA approved from 07/09/22-07/07/23    Previous pharmacotherapy for medical weight loss:  None   Bariatric surgery:  Patient has not had bariatric surgery.    Vit D deficiency  She is taking Vit D 50,000 international units every 2 weeks.  Denies side effects.  Denies nausea, vomiting or muscle weakness.    Lab Results  Component Value Date   VD25OH 58.6 11/20/2022   VD25OH 28.5 (L) 04/03/2022     PHYSICAL EXAM:  Blood pressure  123/87, pulse 74, temperature 98.1 F (36.7 C), height 5\' 8"  (1.727 m), weight 230 lb (104.3 kg), last menstrual period 01/08/2023, SpO2 100%. Body mass index is 34.97 kg/m.  General: She is overweight, cooperative, alert, well developed, and in no acute distress. PSYCH: Has normal mood, affect and thought process.   Extremities: No edema.  Neurologic: No gross sensory or motor deficits. No tremors or fasciculations noted.    DIAGNOSTIC DATA REVIEWED:  BMET    Component Value Date/Time   NA 137 11/20/2022 1234   K 4.2 11/20/2022 1234   CL 102 11/20/2022 1234   CO2 21 11/20/2022 1234   GLUCOSE 104 (H) 11/20/2022 1234   GLUCOSE 91 03/20/2016 0925   BUN 9 11/20/2022 1234   CREATININE 0.70 11/20/2022 1234   CREATININE 0.68 03/20/2016 0925   CALCIUM 9.3 11/20/2022 1234   GFRNONAA >89 03/20/2016 0925   GFRAA >89 03/20/2016 0925   Lab Results  Component Value Date   HGBA1C 5.1 03/12/2022   HGBA1C 5.0 02/24/2013   Lab Results  Component Value Date   INSULIN 8.9 04/03/2022   Lab Results  Component Value Date   TSH 3.160 03/12/2022   CBC    Component Value Date/Time   WBC 5.0 03/12/2022 0920   RBC 4.51 03/12/2022 0920   HGB 14.0 03/12/2022 0920   HCT 40.2 03/12/2022 0920   PLT 189 03/12/2022  0920   MCV 89 03/12/2022 0920   MCH 31.0 03/12/2022 0920   MCHC 34.8 03/12/2022 0920   RDW 12.9 03/12/2022 0920   Iron Studies No results found for: "IRON", "TIBC", "FERRITIN", "IRONPCTSAT" Lipid Panel     Component Value Date/Time   CHOL 191 03/12/2022 0920   TRIG 147 03/12/2022 0920   HDL 58 03/12/2022 0920   CHOLHDL 3.3 03/12/2022 0920   CHOLHDL 2.6 03/20/2016 0925   VLDL 20 03/20/2016 0925   LDLCALC 107 (H) 03/12/2022 0920   Hepatic Function Panel     Component Value Date/Time   PROT 7.0 11/20/2022 1234   ALBUMIN 4.1 11/20/2022 1234   AST 17 11/20/2022 1234   ALT 30 11/20/2022 1234   ALKPHOS 60 11/20/2022 1234   BILITOT 0.4 11/20/2022 1234      Component  Value Date/Time   TSH 3.160 03/12/2022 0920   Nutritional Lab Results  Component Value Date   VD25OH 58.6 11/20/2022   VD25OH 28.5 (L) 04/03/2022     ASSESSMENT AND PLAN  TREATMENT PLAN FOR OBESITY:  Recommended Dietary Goals  Cyenthia is currently in the action stage of change. As such, her goal is to continue weight management plan. She has agreed to the Category 3 Plan.  Behavioral Intervention  We discussed the following Behavioral Modification Strategies today: continue to work on maintaining a reduced calorie state, getting the recommended amount of protein, incorporating whole foods, making healthy choices, staying well hydrated and practicing mindfulness when eating..  Additional resources provided today: NA  Recommended Physical Activity Goals  Glennisha has been advised to work up to 150 minutes of moderate intensity aerobic activity a week and strengthening exercises 2-3 times per week for cardiovascular health, weight loss maintenance and preservation of muscle mass.   She has agreed to Think about enjoyable ways to increase daily physical activity and overcoming barriers to exercise, Increase physical activity in their day and reduce sedentary time (increase NEAT)., and Work on scheduling and tracking physical activity.    Pharmacotherapy We discussed various medication options to help Varina with her weight loss efforts and we both agreed to increase Wegovy 2.4mg .  Side effects discussed.  ASSOCIATED CONDITIONS ADDRESSED TODAY  Action/Plan  Polyphagia -     ZOXWRU; Inject 2.4 mg into the skin once a week.  Dispense: 3 mL; Refill: 0  Vitamin D deficiency -     Vitamin D (Ergocalciferol); Take 1 capsule (50,000 Units total) by mouth every 14 (fourteen) days.  Dispense: 6 capsule; Refill: 0  Generalized obesity -     Wegovy; Inject 2.4 mg into the skin once a week.  Dispense: 3 mL; Refill: 0  BMI 34.0-34.9,adult         Return in about 4 weeks  (around 02/19/2023).Marland Kitchen She was informed of the importance of frequent follow up visits to maximize her success with intensive lifestyle modifications for her multiple health conditions.   ATTESTASTION STATEMENTS:  Reviewed by clinician on day of visit: allergies, medications, problem list, medical history, surgical history, family history, social history, and previous encounter notes.   Theodis Sato. Delmy Holdren FNP-C

## 2023-02-18 ENCOUNTER — Encounter: Payer: Self-pay | Admitting: Nurse Practitioner

## 2023-02-18 ENCOUNTER — Ambulatory Visit (INDEPENDENT_AMBULATORY_CARE_PROVIDER_SITE_OTHER): Payer: No Typology Code available for payment source | Admitting: Nurse Practitioner

## 2023-02-18 VITALS — BP 131/81 | HR 78 | Temp 97.9°F | Ht 68.0 in | Wt 228.0 lb

## 2023-02-18 DIAGNOSIS — E669 Obesity, unspecified: Secondary | ICD-10-CM

## 2023-02-18 DIAGNOSIS — Z6834 Body mass index (BMI) 34.0-34.9, adult: Secondary | ICD-10-CM

## 2023-02-18 DIAGNOSIS — R632 Polyphagia: Secondary | ICD-10-CM

## 2023-02-18 MED ORDER — WEGOVY 2.4 MG/0.75ML ~~LOC~~ SOAJ: 2.4000 mg | SUBCUTANEOUS | 0 refills | Status: DC

## 2023-02-18 NOTE — Patient Instructions (Signed)
Steps to starting your WegovyT  The office staff will send a prior authorization request to your insurance company for approval. We will send you a mychart message once we hear back from your insurance with a decision.  This can take up to 7-10 business days.   Once your WegovyTis approved, you may then pick up Wegovy pen from your pharmacy.    Learn how to do Wegovy injections on the Wegovy.com website. There is a training video that will walk you through how to safely perform the injection. If you have questions for our clinical staff, please contact our  clinical staff. If you have any symptoms of allergic reaction to WegovyT discontinue immediately and call 911.  1. What should I tell my provider before using WegovyT ? have or have had problems with your pancreas or kidneys. have type 2 diabetes and a history of diabetic retinopathy. have or have had depression, suicidal thoughts, or mental health issues. are pregnant or plan to become pregnant. WegovyT may harm your unborn baby. You should stop using WegovyT 3 months before you plan to become pregnant or if you are breastfeeding or plan to breastfeed. It is not known if WegovyT passes into your breast milk.  2. What is WegovyT and how does it work?  WegovyT is an injectable prescription medication prescribed by your provider to help with your weight loss.  This medicine will be most effective when combined with a reduced calorie diet and physical activity.  WegovyT is not for the treatment of type 2 diabetes mellitus. WegovyT should not be used with other GLP-1 receptor agonist medicines. The addition of WegovyT in  patients treated with insulin has not been evaluated. When initiating WegovyT, consider reducing the dose of concomitantly administered insulin secretagogues (such as sulfonylureas) or insulin to reduce the risk of  hypoglycemia.  One role of GLP-1 is to send a signal to your brain to tell it you are full. It also slows down  stomach emptying which will make you feel full longer and may help with reducing cravings.   3.  How should I take WegovyT?  Administer WegovyT once weekly, on the same day each week, at any time of day, with or without meals Inject subcutaneously in the abdomen, thigh or upper arm Initiate at 0.25 mg once weekly for 4 weeks. In 4 week intervals, increase the dose until a dose of 2.4 mg is reached (we will discuss with you the dosage at each visit). The maintenance dose of WegovyT is 2.4 mg once weekly.  The dosing schedule of Wegovy is:  0.25 mg per week X 4 weeks 0.5 mg per week X 4 weeks 1.0 mg per week X 4 weeks 1.7 mg per week X 4 weeks 2.4 mg per week   Missed dose   If you miss your injection day, go ahead inject your current dose. You can go >7 days, but not <7 days between injections. You may change your injection day (It must be >7 days). If you miss >2 doses, you can still keep next injection dose the same or follow de-escalation schedule which may minimize GI symptoms.   In patients with type 2 diabetes, monitor blood glucose prior to starting and during WEGOVYT treatment.   Inject your dose of Wegovy under the skin (subcutaneous injection) in your stomach area (abdomen), upper leg (thigh) or upper arm. Do not inject into a vein or a muscle. The injection site should be rotated and not given in the   same spot each day. Hold the needle under the skin and count to "10". This will allow all of the medicine to be dispensed under the skin. Always wipe your skin with an alcohol prep pad before injection  Dispose of used pen in an approved sharps container. More practical options that can be put in the trash  to go to the landfill are milk jugs or plastic laundry detergent containers with a screw on lid.  What side effects may I notice from taking WegovyT?  Side effects that usually do not require medical attention (report to our office if they continue or are bothersome): Nausea  (most common but decreases over time in most people as their body gets used to the medicine) Diarrhea Constipation (you may take an over the counter laxative if needed) Headache Decreased appetite Upset stomach Tiredness Dizziness Feeling bloated Hair loss Belching Gas Heartburn  Side effects that you should call 911 as soon as possible Vomiting Stomach pain Fever Yellowing of your skin or eyes  Clay-colored stools Increased heart rate while at rest Low blood sugar  Sudden changes in mood, behaviors, thoughts, feelings, or thoughts of suicide If you get a lump or swelling in your neck, hoarseness, trouble swallowing, or shortness of breath. Allergic reaction such as skin rash, itching, hives, swelling of the face, tongue, or lips  Helpful tips for managing nausea Nausea is a common side effect when first starting WegovyT. If you experience nausea, be sure to connect with your health care provider. He or she will offer guidance on ways to manage it, which may include: Eat bland, low-fat foods, like crackers, toast and rice  Eat foods that contain water, like soups and gelatin  Avoid lying down after you eat  Go outdoors for fresh air  Eat more slowly    Other important information Do not drop your pen or knock it against hard surfaces  Do not expose your pen to any liquids  If you think that your pen may be damaged, do not try to fix it. Use a new one Keep the pen cap on until you are ready to inject. Your pen will no longer be sterile if you store an unused pen without the cap, if you pull the pen cap off and put it on again, or if the pen cap is missing. This could lead to an infection  Store the WegovyT pen in the refrigerator from 36F to 46F (2C to 8C) If needed, before removing the pen cap, WegovyT can be stored from 8C to 30C (46F to 86F) in the original carton for up to 28 days.  Keep WegovyT in the original carton to protect it from light  Do not freeze   Throw away pen if WegovyT has been frozen, has been exposed to light or temperatures above 86F (30C), or has been out of the refrigerator for 28 days or longer It's important to properly dispose of your used WegovyT pens. Do not throw the pen away in your household trash. Instead, use an FDA-cleared sharps disposable container or a sturdy household container with a tight-fitting lid, like a heavy duty plastic container.   Wegovy pen training website: https://www.wegovy.com/about-wegovy/how-to-use-the-wegovy-pen.html  Wegovy savings and support link: https://www.wegovy.com/saving-and-support/save-and-support.html    

## 2023-02-18 NOTE — Progress Notes (Signed)
Office: [phone]  /  Fax: [phone]  WEIGHT SUMMARY AND BIOMETRICS  Weight Lost Since Last Visit: 2lb  Weight Gained Since Last Visit: 0lb   Vitals Temp: 97.9 F (36.6 C) BP: 131/81 Pulse Rate: 78 SpO2: 96 %   Anthropometric Measurements Height: 5\' 8"  (1.727 m) Weight: 228 lb (103.4 kg) BMI (Calculated): 34.68 Weight at Last Visit: 230lb Weight Lost Since Last Visit: 2lb Weight Gained Since Last Visit: 0lb Starting Weight: 262lb Total Weight Loss (lbs): 34 lb (15.4 kg)   Body Composition  Body Fat %: 42.3 % Fat Mass (lbs): 96.6 lbs Muscle Mass (lbs): 125 lbs Total Body Water (lbs): 93.4 lbs Visceral Fat Rating : 11   Other Clinical Data Fasting: No Labs: No Today's Visit #: 15 Starting Date: 04/03/22     HPI  Chief Complaint: OBESITY  Tracy Ford is here to discuss her progress with her obesity treatment plan. She is on the the Category 3 Plan and states she is following her eating plan approximately 60 % of the time. She states she is exercising 0 minutes 0 days per week.   Interval History:  Since last office visit she has lost 2 pounds.  She has been traveling and has had multiple celebrations.    Pharmacotherapy for weight loss: She is currently taking Wegovy 2.4 mg for medical weight loss.  Denies side effects. Denies polyphagia and cravings since increasing Wegovy dose to 2.4mg .    PA approved from 07/09/22-07/07/23    Previous pharmacotherapy for medical weight loss:  None   Bariatric surgery:  Patient has not had bariatric surgery.     PHYSICAL EXAM:  Blood pressure 131/81, pulse 78, temperature 97.9 F (36.6 C), height 5\' 8"  (1.727 m), weight 228 lb (103.4 kg), last menstrual period 01/28/2023, SpO2 96%. Body mass index is 34.67 kg/m.  General: She is overweight, cooperative, alert, well developed, and in no acute distress. PSYCH: Has normal mood, affect and thought process.   Extremities: No edema.  Neurologic: No gross sensory  or motor deficits. No tremors or fasciculations noted.    DIAGNOSTIC DATA REVIEWED:  BMET    Component Value Date/Time   NA 137 11/20/2022 1234   K 4.2 11/20/2022 1234   CL 102 11/20/2022 1234   CO2 21 11/20/2022 1234   GLUCOSE 104 (H) 11/20/2022 1234   GLUCOSE 91 03/20/2016 0925   BUN 9 11/20/2022 1234   CREATININE 0.70 11/20/2022 1234   CREATININE 0.68 03/20/2016 0925   CALCIUM 9.3 11/20/2022 1234   GFRNONAA >89 03/20/2016 0925   GFRAA >89 03/20/2016 0925   Lab Results  Component Value Date   HGBA1C 5.1 03/12/2022   HGBA1C 5.0 02/24/2013   Lab Results  Component Value Date   INSULIN 8.9 04/03/2022   Lab Results  Component Value Date   TSH 3.160 03/12/2022   CBC    Component Value Date/Time   WBC 5.0 03/12/2022 0920   RBC 4.51 03/12/2022 0920   HGB 14.0 03/12/2022 0920   HCT 40.2 03/12/2022 0920   PLT 189 03/12/2022 0920   MCV 89 03/12/2022 0920   MCH 31.0 03/12/2022 0920   MCHC 34.8 03/12/2022 0920   RDW 12.9 03/12/2022 0920   Iron Studies No results found for: "IRON", "TIBC", "FERRITIN", "IRONPCTSAT" Lipid Panel     Component Value Date/Time   CHOL 191 03/12/2022 0920   TRIG 147 03/12/2022 0920   HDL 58 03/12/2022 0920   CHOLHDL 3.3 03/12/2022 0920   CHOLHDL 2.6 03/20/2016 0925  VLDL 20 03/20/2016 0925   LDLCALC 107 (H) 03/12/2022 0920   Hepatic Function Panel     Component Value Date/Time   PROT 7.0 11/20/2022 1234   ALBUMIN 4.1 11/20/2022 1234   AST 17 11/20/2022 1234   ALT 30 11/20/2022 1234   ALKPHOS 60 11/20/2022 1234   BILITOT 0.4 11/20/2022 1234      Component Value Date/Time   TSH 3.160 03/12/2022 0920   Nutritional Lab Results  Component Value Date   VD25OH 58.6 11/20/2022   VD25OH 28.5 (L) 04/03/2022     ASSESSMENT AND PLAN  TREATMENT PLAN FOR OBESITY:  Recommended Dietary Goals  Tracy Ford is currently in the action stage of change. As such, her goal is to continue weight management plan. She has agreed to the  Category 3 Plan.  Behavioral Intervention  We discussed the following Behavioral Modification Strategies today: celebration eating strategies and continue to work on maintaining a reduced calorie state, getting the recommended amount of protein, incorporating whole foods, making healthy choices, staying well hydrated and practicing mindfulness when eating..  Additional resources provided today: NA  Recommended Physical Activity Goals  Tracy Ford has been advised to work up to 150 minutes of moderate intensity aerobic activity a week and strengthening exercises 2-3 times per week for cardiovascular health, weight loss maintenance and preservation of muscle mass.   She has agreed to Think about enjoyable ways to increase daily physical activity and overcoming barriers to exercise, Increase physical activity in their day and reduce sedentary time (increase NEAT)., and Work on scheduling and tracking physical activity.    Pharmacotherapy We discussed various medication options to help Tracy Ford with her weight loss efforts and we both agreed to continue Tracy Ford 2.4mg .  Side effects discussed. .  ASSOCIATED CONDITIONS ADDRESSED TODAY  Action/Plan  Polyphagia -     [id number]; Inject 2.4 mg into the skin once a week.  Dispense: 9 mL; Refill: 0  Generalized obesity -     [id number]; Inject 2.4 mg into the skin once a week.  Dispense: 9 mL; Refill: 0  BMI 34.0-34.9,adult     Plans to schedule CPE and labs in January.      Return in about 8 weeks (around 04/15/2023).Marland Kitchen She was informed of the importance of frequent follow up visits to maximize her success with intensive lifestyle modifications for her multiple health conditions.   ATTESTASTION STATEMENTS:  Reviewed by clinician on day of visit: allergies, medications, problem list, medical history, surgical history, family history, social history, and previous encounter notes.     Theodis Sato. Angelo Prindle FNP-C

## 2023-02-21 ENCOUNTER — Encounter: Payer: Self-pay | Admitting: Nurse Practitioner

## 2023-03-14 ENCOUNTER — Encounter: Payer: Self-pay | Admitting: Family Medicine

## 2023-03-14 ENCOUNTER — Ambulatory Visit (INDEPENDENT_AMBULATORY_CARE_PROVIDER_SITE_OTHER): Payer: No Typology Code available for payment source | Admitting: Family Medicine

## 2023-03-14 VITALS — BP 120/82 | HR 82 | Resp 12 | Ht 68.0 in | Wt 228.1 lb

## 2023-03-14 DIAGNOSIS — Z136 Encounter for screening for cardiovascular disorders: Secondary | ICD-10-CM

## 2023-03-14 DIAGNOSIS — Z1211 Encounter for screening for malignant neoplasm of colon: Secondary | ICD-10-CM

## 2023-03-14 DIAGNOSIS — Z1159 Encounter for screening for other viral diseases: Secondary | ICD-10-CM | POA: Insufficient documentation

## 2023-03-14 DIAGNOSIS — Z0000 Encounter for general adult medical examination without abnormal findings: Secondary | ICD-10-CM

## 2023-03-14 DIAGNOSIS — Z1329 Encounter for screening for other suspected endocrine disorder: Secondary | ICD-10-CM

## 2023-03-14 DIAGNOSIS — Z13228 Encounter for screening for other metabolic disorders: Secondary | ICD-10-CM | POA: Diagnosis not present

## 2023-03-14 DIAGNOSIS — Z13220 Encounter for screening for lipoid disorders: Secondary | ICD-10-CM

## 2023-03-14 NOTE — Progress Notes (Signed)
 Complete physical exam  Patient: Tracy Ford   DOB: [birth year]   47 y.o. Female  MRN: [id number]  Subjective:    Chief Complaint  Patient presents with   Annual Exam    Statrted wegovy  lost around 40 pounds.    Tracy Ford is a 48 y.o. female who presents today for a complete physical exam. She reports consuming a  high protein   diet. The patient does not participate in regular exercise at present. She generally feels well. She reports sleeping well. She does not have additional problems to discuss today.    Most recent fall risk assessment:    03/12/2022    8:33 AM  Fall Risk   Falls in the past year? 0  Number falls in past yr: 0  Injury with Fall? 0  Risk for fall due to : No Fall Risks  Follow up Falls evaluation completed     Most recent depression screenings:    03/12/2022    8:32 AM 02/24/2013    8:52 AM  PHQ 2/9 Scores  PHQ - 2 Score 0 0  PHQ- 9 Score 4     Vision:Within last year and Dental: No current dental problems and Receives regular dental care  Past Medical History:  Diagnosis Date   Anemia    Family history of breast cancer in first degree relative 02/24/2013   GERD (gastroesophageal reflux disease)    HIP PAIN, LEFT 06/30/2009   Qualifier: Diagnosis of   By: Alvan MD, Catherine       Joint pain    Palpitation    Right knee pain 12/11/2013      Patient Care Team: Booker Darice SAUNDERS, FNP as PCP - General (Family Medicine)   Outpatient Medications Prior to Visit  Medication Sig   cetirizine (ZYRTEC) 10 MG chewable tablet Chew 10 mg by mouth daily.   fluticasone  (FLONASE ) 50 MCG/ACT nasal spray Place 2 sprays into both nostrils daily.   ibuprofen (ADVIL) 200 MG tablet Take 200 mg by mouth every 6 (six) hours as needed.   Pseudoephedrine HCl (SUDAFED PO) Take by mouth.   Semaglutide -Weight Management (WEGOVY ) 2.4 MG/0.75ML SOAJ Inject 2.4 mg into the skin once a week.   Vitamin D , Ergocalciferol , (DRISDOL ) 1.25 MG (50000 UNIT) CAPS  capsule Take 1 capsule (50,000 Units total) by mouth every 14 (fourteen) days.   No facility-administered medications prior to visit.    ROS        Objective:     BP 120/82 (BP Location: Left Arm, Patient Position: Sitting)   Pulse 82   Resp 12   Ht 5' 8 (1.727 m)   Wt 228 lb 1.6 oz (103.5 kg)   LMP 01/28/2023 (Approximate)   SpO2 100%   BMI 34.68 kg/m  BP Readings from Last 3 Encounters:  03/14/23 120/82  02/18/23 131/81  01/22/23 123/87      Physical Exam Vitals and nursing note reviewed.  Constitutional:      General: She is not in acute distress.    Appearance: Normal appearance.  HENT:     Right Ear: Tympanic membrane normal.     Left Ear: Tympanic membrane normal.     Nose: Nose normal.     Mouth/Throat:     Mouth: Mucous membranes are moist.     Pharynx: Oropharynx is clear.  Eyes:     Extraocular Movements: Extraocular movements intact.  Neck:     Thyroid: No thyroid tenderness.  Cardiovascular:     Rate  and Rhythm: Normal rate and regular rhythm.     Pulses:          Radial pulses are 2+ on the right side and 2+ on the left side.     Heart sounds: Normal heart sounds, S1 normal and S2 normal.  Pulmonary:     Effort: Pulmonary effort is normal.     Breath sounds: Normal breath sounds.  Abdominal:     General: Bowel sounds are normal.     Palpations: Abdomen is soft.     Tenderness: There is no abdominal tenderness.  Musculoskeletal:        General: Normal range of motion.     Cervical back: Normal range of motion.     Right lower leg: No edema.     Left lower leg: No edema.  Lymphadenopathy:     Cervical:     Right cervical: No superficial cervical adenopathy.    Left cervical: No superficial cervical adenopathy.  Skin:    General: Skin is warm and dry.  Neurological:     General: No focal deficit present.     Mental Status: She is alert. Mental status is at baseline.  Psychiatric:        Mood and Affect: Mood normal.        Behavior:  Behavior normal.        Thought Content: Thought content normal.        Judgment: Judgment normal.      No results found for any visits on 03/14/23.     Assessment & Plan:    Routine Health Maintenance and Physical Exam  Immunization History  Administered Date(s) Administered   Influenza, Seasonal, Injecte, Preservative Fre 02/21/2012   Influenza,inj,Quad PF,6+ Mos 02/24/2013   Influenza,trivalent, recombinat, inj, PF 12/10/2022   Influenza-Unspecified 12/04/2013, 11/30/2014, 01/11/2016, 01/16/2019, 12/29/2021   PFIZER(Purple Top)SARS-COV-2 Vaccination 05/20/2019, 06/10/2019, 12/18/2019   Tdap 02/21/2012, 10/27/2014    Health Maintenance  Topic Date Due   Hepatitis C Screening  Never done   Colonoscopy  Never done   DTaP/Tdap/Td (3 - Td or Tdap) 10/26/2024   Cervical Cancer Screening (HPV/Pap Cotest)  03/13/2027   INFLUENZA VACCINE  Completed   HIV Screening  Completed   HPV VACCINES  Aged Out   COVID-19 Vaccine  Discontinued    Discussed health benefits of physical activity, and encouraged her to engage in regular exercise appropriate for her age and condition.  Annual physical exam  Encounter for lipid screening for cardiovascular disease -     Lipid panel  Encounter for screening for metabolic disorder -     Hemoglobin A1c  Need for hepatitis C screening test -     Hepatitis C antibody  Screening for thyroid disorder -     TSH + free T4  Screening for colon cancer -     Ambulatory referral to Gastroenterology      Routine labs ordered.  HCM reviewed/discussed. Referral placed for colonoscopy. Hep C ordered.  Anticipatory guidance regarding healthy weight, lifestyle and choices given. Recommend healthy diet.  Recommend approximately 150 minutes/week of moderate intensity exercise. Resistance training is good for building muscles and for bone health. Muscle mass helps to increase our metabolism and to burn more calories at rest.  Limit alcohol consumption:  no more than one drink per day for women and 2 drinks per day for me. Recommend regular dental and vision exams. Always use seatbelt/lap and shoulder restraints. Recommend using smoke alarms and checking batteries at least twice  a year. Recommend using sunscreen when outside.  Please know that I am here to help you with all of your health care goals and am happy to work with you to find a solution that works best for you.  The greatest advice I have received with any changes in life are to take it one step at a time, that even means if all you can focus on is the next 60 seconds, then do that and celebrate your victories.  With any changes in life, you will have set backs, and that is OK. The important thing to remember is, if you have a set back, it is not a failure, it is an opportunity to try again! Agrees with plan of care discussed.  Questions answered.      Return in about 1 year (around 03/13/2024) for CPE with labs.     Darice JONELLE Brownie, FNP

## 2023-03-15 LAB — HEMOGLOBIN A1C
Est. average glucose Bld gHb Est-mCnc: 94 mg/dL
Hgb A1c MFr Bld: 4.9 % (ref 4.8–5.6)

## 2023-03-15 LAB — LIPID PANEL
Chol/HDL Ratio: 3.1 {ratio} (ref 0.0–4.4)
Cholesterol, Total: 188 mg/dL (ref 100–199)
HDL: 60 mg/dL (ref >39)
LDL Chol Calc (NIH): 106 mg/dL — ABNORMAL HIGH (ref 0–99)
Triglycerides: 126 mg/dL (ref 0–149)
VLDL Cholesterol Cal: 22 mg/dL (ref 5–40)

## 2023-03-15 LAB — TSH+FREE T4
Free T4: 1.11 ng/dL (ref 0.82–1.77)
TSH: 2.28 u[IU]/mL (ref 0.450–4.500)

## 2023-03-15 LAB — HEPATITIS C ANTIBODY: Hep C Virus Ab: NONREACTIVE

## 2023-04-15 ENCOUNTER — Ambulatory Visit (INDEPENDENT_AMBULATORY_CARE_PROVIDER_SITE_OTHER): Payer: No Typology Code available for payment source | Admitting: Nurse Practitioner

## 2023-04-15 ENCOUNTER — Encounter: Payer: Self-pay | Admitting: Nurse Practitioner

## 2023-04-15 VITALS — BP 125/87 | HR 74 | Temp 97.8°F | Ht 68.0 in | Wt 228.0 lb

## 2023-04-15 DIAGNOSIS — Z6834 Body mass index (BMI) 34.0-34.9, adult: Secondary | ICD-10-CM | POA: Diagnosis not present

## 2023-04-15 DIAGNOSIS — E669 Obesity, unspecified: Secondary | ICD-10-CM

## 2023-04-15 DIAGNOSIS — E66811 Obesity, class 1: Secondary | ICD-10-CM

## 2023-04-15 DIAGNOSIS — R632 Polyphagia: Secondary | ICD-10-CM | POA: Diagnosis not present

## 2023-04-15 DIAGNOSIS — E6609 Other obesity due to excess calories: Secondary | ICD-10-CM

## 2023-04-15 MED ORDER — WEGOVY 2.4 MG/0.75ML ~~LOC~~ SOAJ
2.4000 mg | SUBCUTANEOUS | 0 refills | Status: DC
Start: 2023-04-15 — End: 2023-05-29

## 2023-04-15 NOTE — Progress Notes (Signed)
 Office: [phone]  /  Fax: [phone]  WEIGHT SUMMARY AND BIOMETRICS  Weight Lost Since Last Visit: 0lb  Weight Gained Since Last Visit: 0lb   Vitals Temp: 97.8 F (36.6 C) BP: 125/87 Pulse Rate: 74 SpO2: 100 %   Anthropometric Measurements Height: 5\' 8"  (1.727 m) Weight: 228 lb (103.4 kg) BMI (Calculated): 34.68 Weight at Last Visit: 228lb Weight Lost Since Last Visit: 0lb Weight Gained Since Last Visit: 0lb Starting Weight: 262lb Total Weight Loss (lbs): 34 lb (15.4 kg)   Body Composition  Body Fat %: 42.9 % Fat Mass (lbs): 97.8 lbs Muscle Mass (lbs): 123.8 lbs Total Body Water (lbs): 94.4 lbs Visceral Fat Rating : 11   Other Clinical Data Fasting: Yes Labs: No Today's Visit #: 16 Starting Date: 04/03/22     HPI  Chief Complaint: OBESITY  Tracy Ford is here to discuss her progress with her obesity treatment plan. She is on the the Category 3 Plan and states she is following her eating plan approximately 60 % of the time. She states she is exercising 30 minutes 1 days per week.   Interval History:  Since last office visit she has maintained her weight.  She has overall done well with weight loss.    She is traveling multiple times in March for work.   Pharmacotherapy for weight loss: She is currently taking Wegovy  2.4 mg for medical weight loss.  Denies side effects. Denies polyphagia and cravings since increasing Wegovy  dose to 2.4mg .     PA approved from 07/09/22-07/07/23  She has lost a total of 34 lbs since her initial visit and a total of 33 lbs since starting Wegovy  on 05/01/22.          Previous pharmacotherapy for medical weight loss:  None   Bariatric surgery:  Patient has not had bariatric surgery.    Hyperlipidemia Medication(s): none.   Lab Results  Component Value Date   CHOL 188 03/14/2023   HDL 60 03/14/2023   LDLCALC 106 (H) 03/14/2023   TRIG 126 03/14/2023   CHOLHDL 3.1 03/14/2023   Lab Results  Component Value  Date   ALT 30 11/20/2022   AST 17 11/20/2022   ALKPHOS 60 11/20/2022   BILITOT 0.4 11/20/2022   The 10-year ASCVD risk score (Arnett DK, et al., 2019) is: 0.8%   Values used to calculate the score:     Age: 48 years     Sex: Female     Is Non-Hispanic African American: No     Diabetic: No     Tobacco smoker: No     Systolic Blood Pressure: 125 mmHg     Is BP treated: No     HDL Cholesterol: 60 mg/dL     Total Cholesterol: 188 mg/dL    PHYSICAL EXAM:  Blood pressure 125/87, pulse 74, temperature 97.8 F (36.6 C), height 5\' 8"  (1.727 m), weight 228 lb (103.4 kg), last menstrual period 04/12/2023, SpO2 100%. Body mass index is 34.67 kg/m.  General: She is overweight, cooperative, alert, well developed, and in no acute distress. PSYCH: Has normal mood, affect and thought process.   Extremities: No edema.  Neurologic: No gross sensory or motor deficits. No tremors or fasciculations noted.    DIAGNOSTIC DATA REVIEWED:  BMET    Component Value Date/Time   NA 137 11/20/2022 1234   K 4.2 11/20/2022 1234   CL 102 11/20/2022 1234   CO2 21 11/20/2022 1234   GLUCOSE 104 (H) 11/20/2022 1234  GLUCOSE 91 03/20/2016 0925   BUN 9 11/20/2022 1234   CREATININE 0.70 11/20/2022 1234   CREATININE 0.68 03/20/2016 0925   CALCIUM 9.3 11/20/2022 1234   GFRNONAA >89 03/20/2016 0925   GFRAA >89 03/20/2016 0925   Lab Results  Component Value Date   HGBA1C 4.9 03/14/2023   HGBA1C 5.0 02/24/2013   Lab Results  Component Value Date   INSULIN  8.9 04/03/2022   Lab Results  Component Value Date   TSH 2.280 03/14/2023   CBC    Component Value Date/Time   WBC 5.0 03/12/2022 0920   RBC 4.51 03/12/2022 0920   HGB 14.0 03/12/2022 0920   HCT 40.2 03/12/2022 0920   PLT 189 03/12/2022 0920   MCV 89 03/12/2022 0920   MCH 31.0 03/12/2022 0920   MCHC 34.8 03/12/2022 0920   RDW 12.9 03/12/2022 0920   Iron Studies No results found for: "IRON", "TIBC", "FERRITIN", "IRONPCTSAT" Lipid Panel      Component Value Date/Time   CHOL 188 03/14/2023 0821   TRIG 126 03/14/2023 0821   HDL 60 03/14/2023 0821   CHOLHDL 3.1 03/14/2023 0821   CHOLHDL 2.6 03/20/2016 0925   VLDL 20 03/20/2016 0925   LDLCALC 106 (H) 03/14/2023 0821   Hepatic Function Panel     Component Value Date/Time   PROT 7.0 11/20/2022 1234   ALBUMIN 4.1 11/20/2022 1234   AST 17 11/20/2022 1234   ALT 30 11/20/2022 1234   ALKPHOS 60 11/20/2022 1234   BILITOT 0.4 11/20/2022 1234      Component Value Date/Time   TSH 2.280 03/14/2023 0821   Nutritional Lab Results  Component Value Date   VD25OH 58.6 11/20/2022   VD25OH 28.5 (L) 04/03/2022     ASSESSMENT AND PLAN  TREATMENT PLAN FOR OBESITY:  Recommended Dietary Goals  Tracy Ford is currently in the action stage of change. As such, her goal is to continue weight management plan. She has agreed to the Category 3 Plan.  Behavioral Intervention  We discussed the following Behavioral Modification Strategies today: increasing lean protein intake to established goals, decreasing simple carbohydrates , increasing vegetables, increasing fiber rich foods, increasing water intake , work on meal planning and preparation, work on Counselling psychologist calories using tracking application, reading food labels , keeping healthy foods at home, continue to work on implementation of reduced calorie nutritional plan, continue to practice mindfulness when eating, planning for success, and continue to work on maintaining a reduced calorie state, getting the recommended amount of protein, incorporating whole foods, making healthy choices, staying well hydrated and practicing mindfulness when eating..  Additional resources provided today: NA  Recommended Physical Activity Goals  Tracy Ford has been advised to work up to 150 minutes of moderate intensity aerobic activity a week and strengthening exercises 2-3 times per week for cardiovascular health, weight loss maintenance and  preservation of muscle mass.   She has agreed to Think about enjoyable ways to increase daily physical activity and overcoming barriers to exercise, Increase physical activity in their day and reduce sedentary time (increase NEAT)., Increase the intensity, frequency or duration of strengthening exercises , and Increase the intensity, frequency or duration of aerobic exercises     Pharmacotherapy We discussed various medication options to help Tracy Ford with her weight loss efforts and we both agreed to continue Wegovy  2.4mg . Side effects discussesd.  ASSOCIATED CONDITIONS ADDRESSED TODAY  Action/Plan  Polyphagia -     Wegovy ; Inject 2.4 mg into the skin once a week.  Dispense: 9  mL; Refill: 0  Class 1 obesity due to excess calories without serious comorbidity with body mass index (BMI) of 34.0 to 34.9 in adult -     Wegovy ; Inject 2.4 mg into the skin once a week.  Dispense: 9 mL; Refill: 0  Generalized obesity -     Wegovy ; Inject 2.4 mg into the skin once a week.  Dispense: 9 mL; Refill: 0       Patient recently had labs on 03/14/2023 during CPE with PCP.  Return in about 4 weeks (around 05/13/2023).Aaron Aas She was informed of the importance of frequent follow up visits to maximize her success with intensive lifestyle modifications for her multiple health conditions.   ATTESTASTION STATEMENTS:  Reviewed by clinician on day of visit: allergies, medications, problem list, medical history, surgical history, family history, social history, and previous encounter notes.     Crist Dominion. Cordelro Gautreau FNP-C

## 2023-05-29 ENCOUNTER — Ambulatory Visit (INDEPENDENT_AMBULATORY_CARE_PROVIDER_SITE_OTHER): Payer: No Typology Code available for payment source | Admitting: Nurse Practitioner

## 2023-05-29 ENCOUNTER — Encounter: Payer: Self-pay | Admitting: Nurse Practitioner

## 2023-05-29 VITALS — BP 135/91 | HR 77 | Temp 98.1°F | Ht 68.0 in | Wt 226.0 lb

## 2023-05-29 DIAGNOSIS — E66811 Obesity, class 1: Secondary | ICD-10-CM

## 2023-05-29 DIAGNOSIS — Z6834 Body mass index (BMI) 34.0-34.9, adult: Secondary | ICD-10-CM

## 2023-05-29 DIAGNOSIS — R632 Polyphagia: Secondary | ICD-10-CM

## 2023-05-29 DIAGNOSIS — E6609 Other obesity due to excess calories: Secondary | ICD-10-CM

## 2023-05-29 MED ORDER — WEGOVY 2.4 MG/0.75ML ~~LOC~~ SOAJ: 2.4000 mg | SUBCUTANEOUS | 0 refills | Status: DC

## 2023-05-29 NOTE — Progress Notes (Signed)
 Office: [phone]  /  Fax: [phone]  WEIGHT SUMMARY AND BIOMETRICS  Weight Lost Since Last Visit: 2lb  Weight Gained Since Last Visit: 0lb   Vitals Temp: 98.1 F (36.7 C) BP: (!) 135/91 Pulse Rate: 77 SpO2: 100 %   Anthropometric Measurements Height: 5\' 8"  (1.727 m) Weight: 226 lb (102.5 kg) BMI (Calculated): 34.37 Weight at Last Visit: 228lb Weight Lost Since Last Visit: 2lb Weight Gained Since Last Visit: 0lb Starting Weight: 262lb Total Weight Loss (lbs): 36 lb (16.3 kg)   Body Composition  Body Fat %: 42.7 % Fat Mass (lbs): 96.8 lbs Muscle Mass (lbs): 123.2 lbs Total Body Water (lbs): 94.4 lbs Visceral Fat Rating : 11   Other Clinical Data Fasting: Yes Labs: No Today's Visit #: 17 Starting Date: 04/03/22     HPI  Chief Complaint: OBESITY  Tracy Ford is here to discuss her progress with her obesity treatment plan. She is on the the Category 3 Plan and states she is following her eating plan approximately 50 % of the time. She states she is exercising 0 minutes 0 days per week.   Interval History:  Since last office visit she has lost 2 pounds. She went to [place] and [employer] since her last visit. She is going to [employer].  She is drinking water daily.  Denies sugary drinks.  Has overall done well with weight loss.    Pharmacotherapy for weight loss:  She is currently taking Wegovy 2.4 mg for medical weight loss.  Denies side effects. Denies polyphagia and cravings since increasing Wegovy dose to 2.4mg .  Doing well.     PA approved from 07/09/22-07/07/23   She has lost a total of 36 lbs since her initial visit and a total of 35 lbs since starting Wegovy on 05/01/22.  Previous pharmacotherapy for medical weight loss:  None   Bariatric surgery:  Patient has not had bariatric surgery.     PHYSICAL EXAM:  Blood pressure (!) 135/91, pulse 77, temperature 98.1 F (36.7 C), height 5\' 8"  (1.727 m), weight 226 lb (102.5 kg), last  menstrual period 05/01/2023, SpO2 100%. Body mass index is 34.36 kg/m.  General: She is overweight, cooperative, alert, well developed, and in no acute distress. PSYCH: Has normal mood, affect and thought process.   Extremities: No edema.  Neurologic: No gross sensory or motor deficits. No tremors or fasciculations noted.    DIAGNOSTIC DATA REVIEWED:  BMET    Component Value Date/Time   NA 137 11/20/2022 1234   K 4.2 11/20/2022 1234   CL 102 11/20/2022 1234   CO2 21 11/20/2022 1234   GLUCOSE 104 (H) 11/20/2022 1234   GLUCOSE 91 03/20/2016 0925   BUN 9 11/20/2022 1234   CREATININE 0.70 11/20/2022 1234   CREATININE 0.68 03/20/2016 0925   CALCIUM 9.3 11/20/2022 1234   GFRNONAA >89 03/20/2016 0925   GFRAA >89 03/20/2016 0925   Lab Results  Component Value Date   HGBA1C 4.9 03/14/2023   HGBA1C 5.0 02/24/2013   Lab Results  Component Value Date   INSULIN 8.9 04/03/2022   Lab Results  Component Value Date   TSH 2.280 03/14/2023   CBC    Component Value Date/Time   WBC 5.0 03/12/2022 0920   RBC 4.51 03/12/2022 0920   HGB 14.0 03/12/2022 0920   HCT 40.2 03/12/2022 0920   PLT 189 03/12/2022 0920   MCV 89 03/12/2022 0920   MCH 31.0 03/12/2022 0920   MCHC 34.8 03/12/2022 0920  RDW 12.9 03/12/2022 0920   Iron Studies No results found for: "IRON", "TIBC", "FERRITIN", "IRONPCTSAT" Lipid Panel     Component Value Date/Time   CHOL 188 03/14/2023 0821   TRIG 126 03/14/2023 0821   HDL 60 03/14/2023 0821   CHOLHDL 3.1 03/14/2023 0821   CHOLHDL 2.6 03/20/2016 0925   VLDL 20 03/20/2016 0925   LDLCALC 106 (H) 03/14/2023 0821   Hepatic Function Panel     Component Value Date/Time   PROT 7.0 11/20/2022 1234   ALBUMIN 4.1 11/20/2022 1234   AST 17 11/20/2022 1234   ALT 30 11/20/2022 1234   ALKPHOS 60 11/20/2022 1234   BILITOT 0.4 11/20/2022 1234      Component Value Date/Time   TSH 2.280 03/14/2023 0821   Nutritional Lab Results  Component Value Date   VD25OH  58.6 11/20/2022   VD25OH 28.5 (L) 04/03/2022     ASSESSMENT AND PLAN  TREATMENT PLAN FOR OBESITY:  Recommended Dietary Goals  Tracy Ford is currently in the action stage of change. As such, her goal is to continue weight management plan. She has agreed to the Category 3 Plan.  Behavioral Intervention  We discussed the following Behavioral Modification Strategies today: increasing lean protein intake to established goals, decreasing simple carbohydrates , increasing vegetables, increasing fiber rich foods, increasing water intake , reading food labels , keeping healthy foods at home, continue to work on implementation of reduced calorie nutritional plan, continue to practice mindfulness when eating, planning for success, and continue to work on maintaining a reduced calorie state, getting the recommended amount of protein, incorporating whole foods, making healthy choices, staying well hydrated and practicing mindfulness when eating..  Additional resources provided today: NA  Recommended Physical Activity Goals  Tracy Ford has been advised to work up to 150 minutes of moderate intensity aerobic activity a week and strengthening exercises 2-3 times per week for cardiovascular health, weight loss maintenance and preservation of muscle mass.   She has agreed to Think about enjoyable ways to increase daily physical activity and overcoming barriers to exercise, Increase physical activity in their day and reduce sedentary time (increase NEAT)., and Work on scheduling and tracking physical activity.    Pharmacotherapy We discussed various medication options to help Tracy Ford with her weight loss efforts and we both agreed to continue Wegovy 2.4mg .  Side effects discussed.  ASSOCIATED CONDITIONS ADDRESSED TODAY  Action/Plan  Polyphagia -     [id number]; Inject 2.4 mg into the skin once a week.  Dispense: 9 mL; Refill: 0  Class 1 obesity due to excess calories without serious comorbidity with body  mass index (BMI) of 34.0 to 34.9 in adult -     BJYNWG; Inject 2.4 mg into the skin once a week.  Dispense: 9 mL; Refill: 0     Last labs 03/14/23    Return in about 4 weeks (around 06/26/2023).Marland Kitchen She was informed of the importance of frequent follow up visits to maximize her success with intensive lifestyle modifications for her multiple health conditions.   ATTESTASTION STATEMENTS:  Reviewed by clinician on day of visit: allergies, medications, problem list, medical history, surgical history, family history, social history, and previous encounter notes.     Theodis Sato. Nastacia Raybuck FNP-C

## 2023-06-13 ENCOUNTER — Other Ambulatory Visit: Payer: Self-pay | Admitting: Family Medicine

## 2023-06-13 DIAGNOSIS — N63 Unspecified lump in unspecified breast: Secondary | ICD-10-CM

## 2023-07-10 ENCOUNTER — Telehealth: Payer: Self-pay

## 2023-07-10 ENCOUNTER — Encounter: Payer: Self-pay | Admitting: Nurse Practitioner

## 2023-07-10 ENCOUNTER — Ambulatory Visit: Admitting: Nurse Practitioner

## 2023-07-10 VITALS — BP 123/85 | HR 72 | Temp 98.4°F | Ht 68.0 in | Wt 223.0 lb

## 2023-07-10 DIAGNOSIS — E66811 Obesity, class 1: Secondary | ICD-10-CM | POA: Diagnosis not present

## 2023-07-10 DIAGNOSIS — E559 Vitamin D deficiency, unspecified: Secondary | ICD-10-CM

## 2023-07-10 DIAGNOSIS — Z6834 Body mass index (BMI) 34.0-34.9, adult: Secondary | ICD-10-CM

## 2023-07-10 DIAGNOSIS — R632 Polyphagia: Secondary | ICD-10-CM

## 2023-07-10 MED ORDER — VITAMIN D (ERGOCALCIFEROL) 1.25 MG (50000 UNIT) PO CAPS: 50000.0000 [IU] | ORAL_CAPSULE | ORAL | 0 refills | Status: DC

## 2023-07-10 MED ORDER — WEGOVY 2.4 MG/0.75ML ~~LOC~~ SOAJ: 2.4000 mg | SUBCUTANEOUS | 0 refills | Status: DC

## 2023-07-10 NOTE — Telephone Encounter (Signed)
 PA submitted through Cover My Meds for Wegovy . Awaiting insurance determination. Key: [id number]

## 2023-07-10 NOTE — Progress Notes (Signed)
 Office: [phone]  /  Fax: [phone]  WEIGHT SUMMARY AND BIOMETRICS  Weight Lost Since Last Visit: 2lb  Weight Gained Since Last Visit: 0lb   Vitals Temp: 98.4 F (36.9 C) BP: 123/85 Pulse Rate: 72 SpO2: 99 %   Anthropometric Measurements Height: 5\' 8"  (1.727 m) Weight: 223 lb (101.2 kg) BMI (Calculated): 33.91 Weight at Last Visit: 226lb Weight Lost Since Last Visit: 2lb Weight Gained Since Last Visit: 0lb Starting Weight: 262lb Total Weight Loss (lbs): 39 lb (17.7 kg)   Body Composition  Body Fat %: 36.7 % Fat Mass (lbs): 82 lbs Muscle Mass (lbs): 134 lbs Total Body Water (lbs): 96.2 lbs Visceral Fat Rating : 9   Other Clinical Data Fasting: No Labs: No Today's Visit #: 18 Starting Date: 04/03/22     HPI  Chief Complaint: OBESITY  Glynn is here to discuss her progress with her obesity treatment plan. She is on the the Category 3 Plan and states she is following her eating plan approximately 65 % of the time. She states she is exercising 0 minutes 0 days per week.   Interval History:  Since last office visit she has lost 3 pounds.  She is not skipping meals.  She is eating a protein with each meal.  She is drinking a protein shake daily.  She is drinking water daily.   Pharmacotherapy for weight loss:  She is currently taking Wegovy  2.4 mg for medical weight loss.  Denies side effects.    PA approved from 07/09/22-07/07/23   She has lost a total of 39 lbs since her initial visit and a total of 38 lbs since starting Wegovy  on 05/01/22.  She has overall done well with weight loss, >5% weight loss     Previous pharmacotherapy for medical weight loss:  None   Bariatric surgery:  Patient has not had bariatric surgery.   Vit D deficiency  She is taking Vit D 50,000 IU every 2 week.  Denies side effects.  Denies nausea, vomiting or muscle weakness.    Lab Results  Component Value Date   VD25OH 58.6 11/20/2022   VD25OH 28.5 (L) 04/03/2022      PHYSICAL EXAM:  Blood pressure 123/85, pulse 72, temperature 98.4 F (36.9 C), height 5\' 8"  (1.727 m), weight 223 lb (101.2 kg), last menstrual period 06/11/2023, SpO2 99%. Body mass index is 33.91 kg/m.  General: She is overweight, cooperative, alert, well developed, and in no acute distress. PSYCH: Has normal mood, affect and thought process.   Extremities: No edema.  Neurologic: No gross sensory or motor deficits. No tremors or fasciculations noted.    DIAGNOSTIC DATA REVIEWED:  BMET    Component Value Date/Time   NA 137 11/20/2022 1234   K 4.2 11/20/2022 1234   CL 102 11/20/2022 1234   CO2 21 11/20/2022 1234   GLUCOSE 104 (H) 11/20/2022 1234   GLUCOSE 91 03/20/2016 0925   BUN 9 11/20/2022 1234   CREATININE 0.70 11/20/2022 1234   CREATININE 0.68 03/20/2016 0925   CALCIUM 9.3 11/20/2022 1234   GFRNONAA >89 03/20/2016 0925   GFRAA >89 03/20/2016 0925   Lab Results  Component Value Date   HGBA1C 4.9 03/14/2023   HGBA1C 5.0 02/24/2013   Lab Results  Component Value Date   INSULIN  8.9 04/03/2022   Lab Results  Component Value Date   TSH 2.280 03/14/2023   CBC    Component Value Date/Time   WBC 5.0 03/12/2022 0920   RBC 4.51 03/12/2022  0920   HGB 14.0 03/12/2022 0920   HCT 40.2 03/12/2022 0920   PLT 189 03/12/2022 0920   MCV 89 03/12/2022 0920   MCH 31.0 03/12/2022 0920   MCHC 34.8 03/12/2022 0920   RDW 12.9 03/12/2022 0920   Iron Studies No results found for: "IRON", "TIBC", "FERRITIN", "IRONPCTSAT" Lipid Panel     Component Value Date/Time   CHOL 188 03/14/2023 0821   TRIG 126 03/14/2023 0821   HDL 60 03/14/2023 0821   CHOLHDL 3.1 03/14/2023 0821   CHOLHDL 2.6 03/20/2016 0925   VLDL 20 03/20/2016 0925   LDLCALC 106 (H) 03/14/2023 0821   Hepatic Function Panel     Component Value Date/Time   PROT 7.0 11/20/2022 1234   ALBUMIN 4.1 11/20/2022 1234   AST 17 11/20/2022 1234   ALT 30 11/20/2022 1234   ALKPHOS 60 11/20/2022 1234   BILITOT 0.4  11/20/2022 1234      Component Value Date/Time   TSH 2.280 03/14/2023 0821   Nutritional Lab Results  Component Value Date   VD25OH 58.6 11/20/2022   VD25OH 28.5 (L) 04/03/2022     ASSESSMENT AND PLAN  TREATMENT PLAN FOR OBESITY:  Recommended Dietary Goals  Safiyya is currently in the action stage of change. As such, her goal is to continue weight management plan. She has agreed to the Category 3 Plan.  Behavioral Intervention  We discussed the following Behavioral Modification Strategies today: increasing lean protein intake to established goals, decreasing simple carbohydrates , increasing vegetables, increasing fiber rich foods, increasing water intake , and continue to work on maintaining a reduced calorie state, getting the recommended amount of protein, incorporating whole foods, making healthy choices, staying well hydrated and practicing mindfulness when eating..  Additional resources provided today: NA  Recommended Physical Activity Goals  Juman has been advised to work up to 150 minutes of moderate intensity aerobic activity a week and strengthening exercises 2-3 times per week for cardiovascular health, weight loss maintenance and preservation of muscle mass.   She has agreed to Think about enjoyable ways to increase daily physical activity and overcoming barriers to exercise, Increase physical activity in their day and reduce sedentary time (increase NEAT)., and Work on scheduling and tracking physical activity.    Pharmacotherapy We discussed various medication options to help Shaquelle with her weight loss efforts and we both agreed to continue Wegovy  2.4mg .  Side effects discussed.  ASSOCIATED CONDITIONS ADDRESSED TODAY  Action/Plan  Polyphagia -     Wegovy ; Inject 2.4 mg into the skin once a week.  Dispense: 9 mL; Refill: 0  Vitamin D  deficiency -     Vitamin D  (Ergocalciferol ); Take 1 capsule (50,000 Units total) by mouth every 14 (fourteen) days.   Dispense: 6 capsule; Refill: 0  Class 1 obesity due to excess calories without serious comorbidity with body mass index (BMI) of 34.0 to 34.9 in adult -     Wegovy ; Inject 2.4 mg into the skin once a week.  Dispense: 9 mL; Refill: 0      Will obtain labs in July   Return in about 4 weeks (around 08/07/2023).Aaron Aas She was informed of the importance of frequent follow up visits to maximize her success with intensive lifestyle modifications for her multiple health conditions.   ATTESTASTION STATEMENTS:  Reviewed by clinician on day of visit: allergies, medications, problem list, medical history, surgical history, family history, social history, and previous encounter notes.     Crist Dominion. Rilan Eiland FNP-C

## 2023-07-15 ENCOUNTER — Telehealth (INDEPENDENT_AMBULATORY_CARE_PROVIDER_SITE_OTHER): Payer: Self-pay

## 2023-07-15 ENCOUNTER — Encounter (HOSPITAL_COMMUNITY): Payer: Self-pay

## 2023-07-15 ENCOUNTER — Encounter: Payer: Self-pay | Admitting: Nurse Practitioner

## 2023-07-15 NOTE — Telephone Encounter (Signed)
 PA - Wegovy  2.4 mg started

## 2023-07-22 ENCOUNTER — Ambulatory Visit: Admission: RE | Admit: 2023-07-22 | Source: Ambulatory Visit

## 2023-07-22 ENCOUNTER — Ambulatory Visit
Admission: RE | Admit: 2023-07-22 | Discharge: 2023-07-22 | Disposition: A | Discharge status: Home / Self Care | Source: Ambulatory Visit | Attending: Family Medicine | Admitting: Family Medicine

## 2023-07-22 ENCOUNTER — Ambulatory Visit: Payer: Self-pay | Admitting: Family Medicine

## 2023-07-22 DIAGNOSIS — N63 Unspecified lump in unspecified breast: Secondary | ICD-10-CM

## 2023-08-14 ENCOUNTER — Ambulatory Visit (INDEPENDENT_AMBULATORY_CARE_PROVIDER_SITE_OTHER): Admitting: Nurse Practitioner

## 2023-08-14 ENCOUNTER — Encounter: Payer: Self-pay | Admitting: Nurse Practitioner

## 2023-08-14 VITALS — BP 134/89 | HR 80 | Temp 97.8°F | Ht 68.0 in | Wt 223.0 lb

## 2023-08-14 DIAGNOSIS — R632 Polyphagia: Secondary | ICD-10-CM

## 2023-08-14 DIAGNOSIS — Z6833 Body mass index (BMI) 33.0-33.9, adult: Secondary | ICD-10-CM

## 2023-08-14 DIAGNOSIS — E66811 Obesity, class 1: Secondary | ICD-10-CM

## 2023-08-14 NOTE — Progress Notes (Signed)
 Office: [phone]  /  Fax: [phone]  WEIGHT SUMMARY AND BIOMETRICS  Weight Lost Since Last Visit: 0lb  Weight Gained Since Last Visit: 0lb   Vitals Temp: 97.8 F (36.6 C) BP: 134/89 Pulse Rate: 80 SpO2: 97 %   Anthropometric Measurements Height: 5' 8 (1.727 m) Weight: 223 lb (101.2 kg) BMI (Calculated): 33.91 Weight at Last Visit: 223lb Weight Lost Since Last Visit: 0lb Weight Gained Since Last Visit: 0lb Starting Weight: 262lb Total Weight Loss (lbs): 39 lb (17.7 kg)   Body Composition  Body Fat %: 41.2 % Fat Mass (lbs): 92.2 lbs Muscle Mass (lbs): 124.8 lbs Total Body Water (lbs): 91 lbs Visceral Fat Rating : 10   Other Clinical Data Fasting: Yes Labs: No Today's Visit #: 19 Starting Date: 04/03/22     HPI  Chief Complaint: OBESITY  Danyele is here to discuss her progress with her obesity treatment plan. She is on the the Category 3 Plan and states she is following her eating plan approximately 60 % of the time. She states she is exercising 0 minutes 0 days per week.   Interval History:  Since last office visit she has maintained her weight.  Life has been really busy since her last visit.  She notes she has been eating out more.  Feels that she needs to be eating more fruits and vegetables. She is trying to eat more protein. She is snacking on balanced breaks.  She is drinking water, occ soda and a protein shake daily.  She is walking to stay active.    Pharmacotherapy for weight loss: She is currently taking Wegovy  2.4 mg for medical weight loss.  Denies side effects.  Wegovy  has been approved from 07/10/23-07/09/24.    She has lost a total of 39 lbs since her initial visit and a total of 38 lbs since starting Wegovy  on 05/01/22.   Previous pharmacotherapy for medical weight loss:  None   Bariatric surgery:  Patient has not had bariatric surgery.     PHYSICAL EXAM:  Blood pressure 134/89, pulse 80, temperature 97.8 F (36.6 C), height  5' 8 (1.727 m), weight 223 lb (101.2 kg), last menstrual period 08/05/2023, SpO2 97%. Body mass index is 33.91 kg/m.  General: She is overweight, cooperative, alert, well developed, and in no acute distress. PSYCH: Has normal mood, affect and thought process.   Extremities: No edema.  Neurologic: No gross sensory or motor deficits. No tremors or fasciculations noted.    DIAGNOSTIC DATA REVIEWED:  BMET    Component Value Date/Time   NA 137 11/20/2022 1234   K 4.2 11/20/2022 1234   CL 102 11/20/2022 1234   CO2 21 11/20/2022 1234   GLUCOSE 104 (H) 11/20/2022 1234   GLUCOSE 91 03/20/2016 0925   BUN 9 11/20/2022 1234   CREATININE 0.70 11/20/2022 1234   CREATININE 0.68 03/20/2016 0925   CALCIUM 9.3 11/20/2022 1234   GFRNONAA >89 03/20/2016 0925   GFRAA >89 03/20/2016 0925   Lab Results  Component Value Date   HGBA1C 4.9 03/14/2023   HGBA1C 5.0 02/24/2013   Lab Results  Component Value Date   INSULIN  8.9 04/03/2022   Lab Results  Component Value Date   TSH 2.280 03/14/2023   CBC    Component Value Date/Time   WBC 5.0 03/12/2022 0920   RBC 4.51 03/12/2022 0920   HGB 14.0 03/12/2022 0920   HCT 40.2 03/12/2022 0920   PLT 189 03/12/2022 0920   MCV 89 03/12/2022 0920  MCH 31.0 03/12/2022 0920   MCHC 34.8 03/12/2022 0920   RDW 12.9 03/12/2022 0920   Iron Studies No results found for: IRON, TIBC, FERRITIN, IRONPCTSAT Lipid Panel     Component Value Date/Time   CHOL 188 03/14/2023 0821   TRIG 126 03/14/2023 0821   HDL 60 03/14/2023 0821   CHOLHDL 3.1 03/14/2023 0821   CHOLHDL 2.6 03/20/2016 0925   VLDL 20 03/20/2016 0925   LDLCALC 106 (H) 03/14/2023 0821   Hepatic Function Panel     Component Value Date/Time   PROT 7.0 11/20/2022 1234   ALBUMIN 4.1 11/20/2022 1234   AST 17 11/20/2022 1234   ALT 30 11/20/2022 1234   ALKPHOS 60 11/20/2022 1234   BILITOT 0.4 11/20/2022 1234      Component Value Date/Time   TSH 2.280 03/14/2023 0821    Nutritional Lab Results  Component Value Date   VD25OH 58.6 11/20/2022   VD25OH 28.5 (L) 04/03/2022     ASSESSMENT AND PLAN  TREATMENT PLAN FOR OBESITY:  Recommended Dietary Goals  Tashona is currently in the action stage of change. As such, her goal is to continue weight management plan. She has agreed to practicing portion control and making smarter food choices, such as increasing vegetables and decreasing simple carbohydrates.  Behavioral Intervention  We discussed the following Behavioral Modification Strategies today: increasing lean protein intake to established goals, decreasing simple carbohydrates , increasing vegetables, increasing fiber rich foods, increasing water intake , and continue to work on maintaining a reduced calorie state, getting the recommended amount of protein, incorporating whole foods, making healthy choices, staying well hydrated and practicing mindfulness when eating..  Additional resources provided today: NA  Recommended Physical Activity Goals  Gilberto has been advised to work up to 150 minutes of moderate intensity aerobic activity a week and strengthening exercises 2-3 times per week for cardiovascular health, weight loss maintenance and preservation of muscle mass.   She has agreed to Think about enjoyable ways to increase daily physical activity and overcoming barriers to exercise, Increase physical activity in their day and reduce sedentary time (increase NEAT)., and Work on scheduling and tracking physical activity.    Pharmacotherapy We discussed various medication options to help Zayley with her weight loss efforts and we both agreed to continue Wegovy  2.4mg . Side effects discussed.  ASSOCIATED CONDITIONS ADDRESSED TODAY  Action/Plan  Polyphagia Continue Wegovy  2.4mg .  Polyphagia has improved since starting Wegovy   Class 1 obesity due to excess calories without serious comorbidity with body mass index (BMI) of 33.0 to 33.9 in  adult       Return in about 6 weeks (around 09/25/2023).Aaron Aas She was informed of the importance of frequent follow up visits to maximize her success with intensive lifestyle modifications for her multiple health conditions.   ATTESTASTION STATEMENTS:  Reviewed by clinician on day of visit: allergies, medications, problem list, medical history, surgical history, family history, social history, and previous encounter notes.   Time spent on visit including pre-visit chart review and post-visit care and charting was 30 minutes.    Crist Dominion. Nekisha Mcdiarmid FNP-C

## 2023-09-25 ENCOUNTER — Encounter: Payer: Self-pay | Admitting: Nurse Practitioner

## 2023-09-25 ENCOUNTER — Ambulatory Visit (INDEPENDENT_AMBULATORY_CARE_PROVIDER_SITE_OTHER): Admitting: Nurse Practitioner

## 2023-09-25 VITALS — BP 122/85 | HR 77 | Temp 98.1°F | Ht 68.0 in | Wt 225.0 lb

## 2023-09-25 DIAGNOSIS — E66811 Obesity, class 1: Secondary | ICD-10-CM | POA: Diagnosis not present

## 2023-09-25 DIAGNOSIS — R632 Polyphagia: Secondary | ICD-10-CM | POA: Diagnosis not present

## 2023-09-25 DIAGNOSIS — Z6834 Body mass index (BMI) 34.0-34.9, adult: Secondary | ICD-10-CM

## 2023-09-25 DIAGNOSIS — E6609 Other obesity due to excess calories: Secondary | ICD-10-CM

## 2023-09-25 DIAGNOSIS — E559 Vitamin D deficiency, unspecified: Secondary | ICD-10-CM

## 2023-09-25 MED ORDER — VITAMIN D (ERGOCALCIFEROL) 1.25 MG (50000 UNIT) PO CAPS: 50000.0000 [IU] | ORAL_CAPSULE | ORAL | 0 refills | Status: DC

## 2023-09-25 NOTE — Progress Notes (Signed)
 Office: [phone]  /  Fax: [phone]  WEIGHT SUMMARY AND BIOMETRICS  Weight Lost Since Last Visit: 0lb  Weight Gained Since Last Visit: 2lb   Vitals Temp: 98.1 F (36.7 C) BP: 122/85 Pulse Rate: 77 SpO2: 98 %   Anthropometric Measurements Height: 5' 8 (1.727 m) Weight: 225 lb (102.1 kg) BMI (Calculated): 34.22 Weight at Last Visit: 223lb Weight Lost Since Last Visit: 0lb Weight Gained Since Last Visit: 2lb Starting Weight: 262lb Total Weight Loss (lbs): 37 lb (16.8 kg)   Body Composition  Body Fat %: 41.9 % Fat Mass (lbs): 94.6 lbs Muscle Mass (lbs): 124.4 lbs Total Body Water (lbs): 92.6 lbs Visceral Fat Rating : 11   Other Clinical Data Fasting: Yes Labs: No Today's Visit #: 20 Starting Date: 04/03/22     HPI  Chief Complaint: OBESITY  Tracy Ford is here to discuss her progress with her obesity treatment plan. She is on the the Category 3 Plan and states she is following her eating plan approximately 50 % of the time. She states she is exercising 0 minutes 0 days per week.   Interval History:  Since last office visit she has gained 2 pounds. She has been traveling for work and has gotten off track. She has gotten back on track since getting home.  She is going to the beach next week.  She is drinking water and coffee daily, occ diet coke.    Her highest weight was 264 lbs.   Pharmacotherapy for weight loss: She is currently taking Wegovy  2.4 mg for medical weight loss.  Denies side effects.   Wegovy  has been approved from 07/10/23-07/09/24.     She has lost a total of 37 lbs since her initial visit and a total of 36 lbs since starting Wegovy  on 05/01/22.    Previous pharmacotherapy for medical weight loss:  None   Bariatric surgery:  Patient has not had bariatric surgery.    Vit D deficiency  She is taking Vit D 50,000 IU every 2 weeks.  Denies side effects.  Denies nausea, vomiting or muscle weakness.    Lab Results  Component Value Date    VD25OH 58.6 11/20/2022   VD25OH 28.5 (L) 04/03/2022      PHYSICAL EXAM:  Blood pressure 122/85, pulse 77, temperature 98.1 F (36.7 C), height 5' 8 (1.727 m), weight 225 lb (102.1 kg), SpO2 98%. Body mass index is 34.21 kg/m.  General: She is overweight, cooperative, alert, well developed, and in no acute distress. PSYCH: Has normal mood, affect and thought process.   Extremities: No edema.  Neurologic: No gross sensory or motor deficits. No tremors or fasciculations noted.    DIAGNOSTIC DATA REVIEWED:  BMET    Component Value Date/Time   NA 137 11/20/2022 1234   K 4.2 11/20/2022 1234   CL 102 11/20/2022 1234   CO2 21 11/20/2022 1234   GLUCOSE 104 (H) 11/20/2022 1234   GLUCOSE 91 03/20/2016 0925   BUN 9 11/20/2022 1234   CREATININE 0.70 11/20/2022 1234   CREATININE 0.68 03/20/2016 0925   CALCIUM 9.3 11/20/2022 1234   GFRNONAA >89 03/20/2016 0925   GFRAA >89 03/20/2016 0925   Lab Results  Component Value Date   HGBA1C 4.9 03/14/2023   HGBA1C 5.0 02/24/2013   Lab Results  Component Value Date   INSULIN  8.9 04/03/2022   Lab Results  Component Value Date   TSH 2.280 03/14/2023   CBC    Component Value Date/Time   WBC 5.0  03/12/2022 0920   RBC 4.51 03/12/2022 0920   HGB 14.0 03/12/2022 0920   HCT 40.2 03/12/2022 0920   PLT 189 03/12/2022 0920   MCV 89 03/12/2022 0920   MCH 31.0 03/12/2022 0920   MCHC 34.8 03/12/2022 0920   RDW 12.9 03/12/2022 0920   Iron Studies No results found for: IRON, TIBC, FERRITIN, IRONPCTSAT Lipid Panel     Component Value Date/Time   CHOL 188 03/14/2023 0821   TRIG 126 03/14/2023 0821   HDL 60 03/14/2023 0821   CHOLHDL 3.1 03/14/2023 0821   CHOLHDL 2.6 03/20/2016 0925   VLDL 20 03/20/2016 0925   LDLCALC 106 (H) 03/14/2023 0821   Hepatic Function Panel     Component Value Date/Time   PROT 7.0 11/20/2022 1234   ALBUMIN 4.1 11/20/2022 1234   AST 17 11/20/2022 1234   ALT 30 11/20/2022 1234   ALKPHOS 60  11/20/2022 1234   BILITOT 0.4 11/20/2022 1234      Component Value Date/Time   TSH 2.280 03/14/2023 0821   Nutritional Lab Results  Component Value Date   VD25OH 58.6 11/20/2022   VD25OH 28.5 (L) 04/03/2022     ASSESSMENT AND PLAN  TREATMENT PLAN FOR OBESITY:  Recommended Dietary Goals  Vern is currently in the action stage of change. As such, her goal is to continue weight management plan. She has agreed to practicing portion control and making smarter food choices, such as increasing vegetables and decreasing simple carbohydrates.  Behavioral Intervention  We discussed the following Behavioral Modification Strategies today: increasing lean protein intake to established goals, decreasing simple carbohydrates , increasing vegetables, increasing fiber rich foods, increasing water intake , work on meal planning and preparation, reading food labels , keeping healthy foods at home, better snacking choices, and continue to work on maintaining a reduced calorie state, getting the recommended amount of protein, incorporating whole foods, making healthy choices, staying well hydrated and practicing mindfulness when eating..  Additional resources provided today: NA  Recommended Physical Activity Goals  Jazalynn has been advised to work up to 150 minutes of moderate intensity aerobic activity a week and strengthening exercises 2-3 times per week for cardiovascular health, weight loss maintenance and preservation of muscle mass.   She has agreed to Think about enjoyable ways to increase daily physical activity and overcoming barriers to exercise, Increase physical activity in their day and reduce sedentary time (increase NEAT)., and Work on scheduling and tracking physical activity.    Pharmacotherapy We discussed various medication options to help Artasia with her weight loss efforts and we both agreed to continue Wegovy  2.4mg .  Side effects discussed.  ASSOCIATED CONDITIONS  ADDRESSED TODAY  Action/Plan  Vitamin D  deficiency -     Vitamin D  (Ergocalciferol ); Take 1 capsule (50,000 Units total) by mouth every 14 (fourteen) days.  Dispense: 6 capsule; Refill: 0  Polyphagia Continue Wegovy  2.4mg   Class 1 obesity due to excess calories without serious comorbidity with body mass index (BMI) of 34.0 to 34.9 in adult  Doing well, continue Wegovy  2.4mg .       Return in about 6 weeks (around 11/06/2023).SABRA She was informed of the importance of frequent follow up visits to maximize her success with intensive lifestyle modifications for her multiple health conditions.   ATTESTASTION STATEMENTS:  Reviewed by clinician on day of visit: allergies, medications, problem list, medical history, surgical history, family history, social history, and previous encounter notes.     Corean SAUNDERS. Kealohilani Maiorino FNP-C

## 2023-11-06 ENCOUNTER — Ambulatory Visit (INDEPENDENT_AMBULATORY_CARE_PROVIDER_SITE_OTHER): Admitting: Nurse Practitioner

## 2023-11-06 ENCOUNTER — Encounter: Payer: Self-pay | Admitting: Nurse Practitioner

## 2023-11-06 VITALS — BP 120/82 | HR 68 | Temp 97.7°F | Ht 68.0 in | Wt 228.0 lb

## 2023-11-06 DIAGNOSIS — Z6834 Body mass index (BMI) 34.0-34.9, adult: Secondary | ICD-10-CM | POA: Diagnosis not present

## 2023-11-06 DIAGNOSIS — E66811 Obesity, class 1: Secondary | ICD-10-CM

## 2023-11-06 DIAGNOSIS — R632 Polyphagia: Secondary | ICD-10-CM

## 2023-11-06 DIAGNOSIS — E6609 Other obesity due to excess calories: Secondary | ICD-10-CM

## 2023-11-06 MED ORDER — ZEPBOUND 5 MG/0.5ML ~~LOC~~ SOAJ: 5.0000 mg | SUBCUTANEOUS | 0 refills | Status: DC

## 2023-11-06 NOTE — Patient Instructions (Signed)

## 2023-11-06 NOTE — Progress Notes (Signed)
 Office: [phone]  /  Fax: [phone]  WEIGHT SUMMARY AND BIOMETRICS  Weight Lost Since Last Visit: 0lb  Weight Gained Since Last Visit: 3lb   Vitals Temp: 97.7 F (36.5 C) BP: 120/82 Pulse Rate: 68 SpO2: 100 %   Anthropometric Measurements Height: 5' 8 (1.727 m) Weight: 228 lb (103.4 kg) BMI (Calculated): 34.68 Weight at Last Visit: 225lb Weight Lost Since Last Visit: 0lb Weight Gained Since Last Visit: 3lb Starting Weight: 262lb Total Weight Loss (lbs): 34 lb (15.4 kg)   Body Composition  Body Fat %: 42.4 % Fat Mass (lbs): 96.8 lbs Muscle Mass (lbs): 125 lbs Total Body Water (lbs): 97 lbs Visceral Fat Rating : 11   Other Clinical Data Fasting: No Labs: No Today's Visit #: 21 Starting Date: 04/03/22     HPI  Chief Complaint: OBESITY  Pantera is here to discuss her progress with her obesity treatment plan. She is on the the Category 3 Plan and states she is following her eating plan approximately 50 % of the time. She states she is exercising 20 minutes 2 days per week.   Interval History:  Since last office visit she has gained 3 pounds.  She is frustrated with her weight gain.  She is eating out 2-3 days per week.  She has gotten off track due to summer and traveling.  She feels now that summer is over she is getting back on a routine.  She is going to see her son in October and going to [place] in November. She is drinking water and milk daily.  She is walking and working in her yard to stay active.   Water weight is up 4.4 lbs today.    Her highest weight was 264 lbs.    Pharmacotherapy for weight loss: She is currently taking Wegovy  2.4 mg for medical weight loss.  Denies side effects.   Wegovy  has been approved from 07/10/23-07/09/24.     She has lost a total of 34 lbs since her initial visit and a total of 33 lbs since starting Wegovy  on 05/01/22.    Previous pharmacotherapy for medical weight loss:  None   Bariatric surgery:  Patient  has not had bariatric surgery.       PHYSICAL EXAM:  Blood pressure 120/82, pulse 68, temperature 97.7 F (36.5 C), height 5' 8 (1.727 m), weight 228 lb (103.4 kg), last menstrual period 10/23/2023, SpO2 100%. Body mass index is 34.67 kg/m.  General: She is overweight, cooperative, alert, well developed, and in no acute distress. PSYCH: Has normal mood, affect and thought process.   Extremities: No edema.  Neurologic: No gross sensory or motor deficits. No tremors or fasciculations noted.    DIAGNOSTIC DATA REVIEWED:  BMET    Component Value Date/Time   NA 137 11/20/2022 1234   K 4.2 11/20/2022 1234   CL 102 11/20/2022 1234   CO2 21 11/20/2022 1234   GLUCOSE 104 (H) 11/20/2022 1234   GLUCOSE 91 03/20/2016 0925   BUN 9 11/20/2022 1234   CREATININE 0.70 11/20/2022 1234   CREATININE 0.68 03/20/2016 0925   CALCIUM 9.3 11/20/2022 1234   GFRNONAA >89 03/20/2016 0925   GFRAA >89 03/20/2016 0925   Lab Results  Component Value Date   HGBA1C 4.9 03/14/2023   HGBA1C 5.0 02/24/2013   Lab Results  Component Value Date   INSULIN  8.9 04/03/2022   Lab Results  Component Value Date   TSH 2.280 03/14/2023   CBC    Component  Value Date/Time   WBC 5.0 03/12/2022 0920   RBC 4.51 03/12/2022 0920   HGB 14.0 03/12/2022 0920   HCT 40.2 03/12/2022 0920   PLT 189 03/12/2022 0920   MCV 89 03/12/2022 0920   MCH 31.0 03/12/2022 0920   MCHC 34.8 03/12/2022 0920   RDW 12.9 03/12/2022 0920   Iron Studies No results found for: IRON, TIBC, FERRITIN, IRONPCTSAT Lipid Panel     Component Value Date/Time   CHOL 188 03/14/2023 0821   TRIG 126 03/14/2023 0821   HDL 60 03/14/2023 0821   CHOLHDL 3.1 03/14/2023 0821   CHOLHDL 2.6 03/20/2016 0925   VLDL 20 03/20/2016 0925   LDLCALC 106 (H) 03/14/2023 0821   Hepatic Function Panel     Component Value Date/Time   PROT 7.0 11/20/2022 1234   ALBUMIN 4.1 11/20/2022 1234   AST 17 11/20/2022 1234   ALT 30 11/20/2022 1234    ALKPHOS 60 11/20/2022 1234   BILITOT 0.4 11/20/2022 1234      Component Value Date/Time   TSH 2.280 03/14/2023 0821   Nutritional Lab Results  Component Value Date   VD25OH 58.6 11/20/2022   VD25OH 28.5 (L) 04/03/2022     ASSESSMENT AND PLAN  TREATMENT PLAN FOR OBESITY:  Recommended Dietary Goals  Clytie is currently in the action stage of change. As such, her goal is to continue weight management plan. She has agreed to to track and will send to me via mychart to review.  Behavioral Intervention  We discussed the following Behavioral Modification Strategies today: increasing lean protein intake to established goals, decreasing simple carbohydrates , increasing vegetables, increasing fiber rich foods, increasing water intake , work on meal planning and preparation, work on tracking and journaling calories using tracking application, continue to work on maintaining a reduced calorie state, getting the recommended amount of protein, incorporating whole foods, making healthy choices, staying well hydrated and practicing mindfulness when eating., and increase protein intake, fibrous foods (25 grams per day for women, 30 grams for men) and water to improve satiety and decrease hunger signals. .  Additional resources provided today: NA  Recommended Physical Activity Goals  Maday has been advised to work up to 150 minutes of moderate intensity aerobic activity a week and strengthening exercises 2-3 times per week for cardiovascular health, weight loss maintenance and preservation of muscle mass.   She has agreed to Think about enjoyable ways to increase daily physical activity and overcoming barriers to exercise, Increase physical activity in their day and reduce sedentary time (increase NEAT)., Continue to gradually increase the amount and intensity of exercise routine, and Combine aerobic and strengthening exercises for efficiency and improved cardiometabolic  health.   Pharmacotherapy We discussed various medication options to help Aalivia with her weight loss efforts and we both agreed to stop Wegovy  and start Zepbound  5mg .  Side effects discussed. .  ASSOCIATED CONDITIONS ADDRESSED TODAY  Action/Plan  Polyphagia -     Zepbound ; Inject 5 mg into the skin once a week.  Dispense: 2 mL; Refill: 0  Class 1 obesity due to excess calories without serious comorbidity with body mass index (BMI) of 34.0 to 34.9 in adult -     Zepbound ; Inject 5 mg into the skin once a week.  Dispense: 2 mL; Refill: 0     Goals Track calories and macros Exercise-cardio and resistance training  Return in about 4 weeks (around 12/04/2023).SABRA She was informed of the importance of frequent follow up visits to maximize her  success with intensive lifestyle modifications for her multiple health conditions.   ATTESTASTION STATEMENTS:  Reviewed by clinician on day of visit: allergies, medications, problem list, medical history, surgical history, family history, social history, and previous encounter notes.     Corean SAUNDERS. Carlie Solorzano FNP-C

## 2023-11-07 ENCOUNTER — Telehealth: Payer: Self-pay

## 2023-11-07 NOTE — Telephone Encounter (Signed)
 PA submitted through Cover My Meds for Zepbound . Awaiting insurance determination. Key: [id number]

## 2023-11-07 NOTE — Telephone Encounter (Signed)
 Per Cover My Meds:  PA has been Approved. PA End Date: 11/06/2024

## 2023-11-07 NOTE — Telephone Encounter (Signed)
 Message from Plan PA has been Approved. PA End Date: 11/06/2024   PA for Wegovy  2.4 has been approved. PA is now complete.

## 2023-12-09 ENCOUNTER — Encounter: Payer: Self-pay | Admitting: Nurse Practitioner

## 2023-12-09 ENCOUNTER — Ambulatory Visit: Admitting: Nurse Practitioner

## 2023-12-09 VITALS — BP 132/80 | HR 82 | Temp 98.3°F | Ht 68.0 in | Wt 231.0 lb

## 2023-12-09 DIAGNOSIS — E66812 Obesity, class 2: Secondary | ICD-10-CM | POA: Diagnosis not present

## 2023-12-09 DIAGNOSIS — Z6835 Body mass index (BMI) 35.0-35.9, adult: Secondary | ICD-10-CM

## 2023-12-09 DIAGNOSIS — R632 Polyphagia: Secondary | ICD-10-CM | POA: Diagnosis not present

## 2023-12-09 MED ORDER — ZEPBOUND 7.5 MG/0.5ML ~~LOC~~ SOAJ: 7.5000 mg | SUBCUTANEOUS | 0 refills | Status: DC

## 2023-12-09 NOTE — Progress Notes (Signed)
 Office: [phone]  /  Fax: [phone]  WEIGHT SUMMARY AND BIOMETRICS  Weight Lost Since Last Visit: 0lb  Weight Gained Since Last Visit: 3lb   Vitals Temp: 98.3 F (36.8 C) BP: 132/80 Pulse Rate: 82 SpO2: 100 %   Anthropometric Measurements Height: 5' 8 (1.727 m) Weight: 231 lb (104.8 kg) BMI (Calculated): 35.13 Weight at Last Visit: 228lb Weight Lost Since Last Visit: 0lb Weight Gained Since Last Visit: 3lb Starting Weight: 262lb Total Weight Loss (lbs): 31 lb (14.1 kg)   Body Composition  Body Fat %: 43 % Fat Mass (lbs): 99.6 lbs Muscle Mass (lbs): 125.6 lbs Total Body Water (lbs): 97.6 lbs Visceral Fat Rating : 11   Other Clinical Data Fasting: No Labs: No Today's Visit #: 22 Starting Date: 04/03/22     HPI  Chief Complaint: OBESITY  Tracy Ford is here to discuss her progress with her obesity treatment plan. She is on the the Category 3 Plan and states she is following her eating plan approximately 60 % of the time. She states she is exercising 30 minutes 2 days per week.   Interval History:  Since last office visit she has gained 3 pounds.  She has been intent with portion control and what I'm eating.  She is frustrated with her weight gain.    BF:  protein shake (30 grams) Snack:  balanced breaks or chick peas Lunch:  sandwich with [place] with mayo or frozen meal Snack:  grapes or balanced break or chick peas or cheese Dinner:  protein, carb and vegetable Snack:  none Drinks:  water, fat free milk and occ diet soda-denies sugary drinks  Exercise:  walking 2 days per week  Her highest weight was 264 lbs.    Pharmacotherapy for weight loss: She is currently taking Zepbound  5 mg for medical weight loss.  Denies side effects.  Notes an increase in polyphagia and cravings since switching from Wegovy  to Zepbound .    Zepbound  approved 11/06/23-11/06/24   PHYSICAL EXAM:  Blood pressure 132/80, pulse 82, temperature 98.3 F (36.8 C), height  5' 8 (1.727 m), weight 231 lb (104.8 kg), last menstrual period 11/18/2023, SpO2 100%. Body mass index is 35.12 kg/m.  General: She is overweight, cooperative, alert, well developed, and in no acute distress. PSYCH: Has normal mood, affect and thought process.   Extremities: No edema.  Neurologic: No gross sensory or motor deficits. No tremors or fasciculations noted.    DIAGNOSTIC DATA REVIEWED:  BMET    Component Value Date/Time   NA 137 11/20/2022 1234   K 4.2 11/20/2022 1234   CL 102 11/20/2022 1234   CO2 21 11/20/2022 1234   GLUCOSE 104 (H) 11/20/2022 1234   GLUCOSE 91 03/20/2016 0925   BUN 9 11/20/2022 1234   CREATININE 0.70 11/20/2022 1234   CREATININE 0.68 03/20/2016 0925   CALCIUM 9.3 11/20/2022 1234   GFRNONAA >89 03/20/2016 0925   GFRAA >89 03/20/2016 0925   Lab Results  Component Value Date   HGBA1C 4.9 03/14/2023   HGBA1C 5.0 02/24/2013   Lab Results  Component Value Date   INSULIN  8.9 04/03/2022   Lab Results  Component Value Date   TSH 2.280 03/14/2023   CBC    Component Value Date/Time   WBC 5.0 03/12/2022 0920   RBC 4.51 03/12/2022 0920   HGB 14.0 03/12/2022 0920   HCT 40.2 03/12/2022 0920   PLT 189 03/12/2022 0920   MCV 89 03/12/2022 0920   MCH 31.0 03/12/2022 0920  MCHC 34.8 03/12/2022 0920   RDW 12.9 03/12/2022 0920   Iron Studies No results found for: IRON, TIBC, FERRITIN, IRONPCTSAT Lipid Panel     Component Value Date/Time   CHOL 188 03/14/2023 0821   TRIG 126 03/14/2023 0821   HDL 60 03/14/2023 0821   CHOLHDL 3.1 03/14/2023 0821   CHOLHDL 2.6 03/20/2016 0925   VLDL 20 03/20/2016 0925   LDLCALC 106 (H) 03/14/2023 0821   Hepatic Function Panel     Component Value Date/Time   PROT 7.0 11/20/2022 1234   ALBUMIN 4.1 11/20/2022 1234   AST 17 11/20/2022 1234   ALT 30 11/20/2022 1234   ALKPHOS 60 11/20/2022 1234   BILITOT 0.4 11/20/2022 1234      Component Value Date/Time   TSH 2.280 03/14/2023 0821    Nutritional Lab Results  Component Value Date   VD25OH 58.6 11/20/2022   VD25OH 28.5 (L) 04/03/2022     ASSESSMENT AND PLAN  TREATMENT PLAN FOR OBESITY:  Recommended Dietary Goals  Jozelynn is currently in the action stage of change. As such, her goal is to continue weight management plan. She has agreed to track and will review at next-calories, protein, carbs and fats.  Behavioral Intervention  We discussed the following Behavioral Modification Strategies today: increasing lean protein intake to established goals, decreasing simple carbohydrates , increasing vegetables, increasing fiber rich foods, increasing water intake , work on meal planning and preparation, work on tracking and journaling calories using tracking application, continue to work on maintaining a reduced calorie state, getting the recommended amount of protein, incorporating whole foods, making healthy choices, staying well hydrated and practicing mindfulness when eating., and increase protein intake, fibrous foods (25 grams per day for women, 30 grams for men) and water to improve satiety and decrease hunger signals. .  Additional resources provided today: NA  Recommended Physical Activity Goals  Medha has been advised to work up to 150 minutes of moderate intensity aerobic activity a week and strengthening exercises 2-3 times per week for cardiovascular health, weight loss maintenance and preservation of muscle mass.   She has agreed to Think about enjoyable ways to increase daily physical activity and overcoming barriers to exercise, Increase physical activity in their day and reduce sedentary time (increase NEAT)., Start strengthening exercises with a goal of 2-3 sessions a week , Work on scheduling and tracking physical activity. , Increase volume of physical activity to a goal of 240 minutes a week, and Combine aerobic and strengthening exercises for efficiency and improved cardiometabolic  health.   Pharmacotherapy We discussed various medication options to help Idali with her weight loss efforts and we both agreed to increase Zepbound  7.5mg .  Side effects discussed.  ASSOCIATED CONDITIONS ADDRESSED TODAY  Action/Plan  Polyphagia -     Zepbound ; Inject 7.5 mg into the skin once a week.  Dispense: 2 mL; Refill: 0  Obesity, Class II, BMI 35-39.9 -     Zepbound ; Inject 7.5 mg into the skin once a week.  Dispense: 2 mL; Refill: 0      Will obtain IC and labs at next visit   Return in about 5 weeks (around 01/13/2024).SABRA She was informed of the importance of frequent follow up visits to maximize her success with intensive lifestyle modifications for her multiple health conditions.   ATTESTASTION STATEMENTS:  Reviewed by clinician on day of visit: allergies, medications, problem list, medical history, surgical history, family history, social history, and previous encounter notes.     Corean SAUNDERS.  Esias Mory FNP-C

## 2024-01-15 ENCOUNTER — Ambulatory Visit (INDEPENDENT_AMBULATORY_CARE_PROVIDER_SITE_OTHER): Admitting: Nurse Practitioner

## 2024-01-15 ENCOUNTER — Encounter: Payer: Self-pay | Admitting: Nurse Practitioner

## 2024-01-15 VITALS — BP 110/79 | HR 72 | Temp 98.3°F | Ht 68.0 in | Wt 231.0 lb

## 2024-01-15 DIAGNOSIS — E66812 Obesity, class 2: Secondary | ICD-10-CM | POA: Diagnosis not present

## 2024-01-15 DIAGNOSIS — E785 Hyperlipidemia, unspecified: Secondary | ICD-10-CM | POA: Diagnosis not present

## 2024-01-15 DIAGNOSIS — R0602 Shortness of breath: Secondary | ICD-10-CM

## 2024-01-15 DIAGNOSIS — R632 Polyphagia: Secondary | ICD-10-CM

## 2024-01-15 DIAGNOSIS — Z79899 Other long term (current) drug therapy: Secondary | ICD-10-CM

## 2024-01-15 DIAGNOSIS — E559 Vitamin D deficiency, unspecified: Secondary | ICD-10-CM | POA: Diagnosis not present

## 2024-01-15 DIAGNOSIS — Z6835 Body mass index (BMI) 35.0-35.9, adult: Secondary | ICD-10-CM

## 2024-01-15 MED ORDER — ZEPBOUND 7.5 MG/0.5ML ~~LOC~~ SOAJ: 7.5000 mg | SUBCUTANEOUS | 0 refills | Status: DC

## 2024-01-15 NOTE — Progress Notes (Signed)
 Office: [phone]  /  Fax: [phone]  WEIGHT SUMMARY AND BIOMETRICS  Weight Lost Since Last Visit: 0lb  Weight Gained Since Last Visit: 0lb   Vitals Temp: 98.3 F (36.8 C) BP: 110/79 Pulse Rate: 72 SpO2: 99 %   Anthropometric Measurements Height: 5' 8 (1.727 m) Weight: 231 lb (104.8 kg) BMI (Calculated): 35.13 Weight at Last Visit: 231lb Weight Lost Since Last Visit: 0lb Weight Gained Since Last Visit: 0lb Starting Weight: 262lb Total Weight Loss (lbs): 31 lb (14.1 kg)   Body Composition  Body Fat %: 43.6 % Fat Mass (lbs): 101.6 lbs Muscle Mass (lbs): 123.6 lbs Total Body Water (lbs): 95.4 lbs Visceral Fat Rating : 11   Other Clinical Data RMR: 1987 Fasting: Yes Labs: No Today's Visit #: 23 Starting Date: 04/03/22     HPI  Chief Complaint: OBESITY  Tracy Ford is here to discuss her progress with her obesity treatment plan. She is on the the Category 3 Plan and states she is following her eating plan approximately 40 % of the time. She states she is exercising 0 minutes 0 days per week.   Interval History:  Since last office visit she has maintained her weight.  She went on vacation last week.  She reports she gained 3-4 lbs and lost since she has gotten back home. She is drinking water and milk daily.  She is not currently exercising.    Her highest weight was 264 lbs.    Pharmacotherapy for weight loss: She is currently taking Zepbound  7.5 mg for medical weight loss.  Denies side effects.  Has helped with polyphagia and craving with increasing the dose.     Zepbound  approved 11/06/23-11/06/24  [facility] with exertion  Last IC was 2635 on 04/03/22 Today IC was 1987 Decreased    PHYSICAL EXAM:  Blood pressure 110/79, pulse 72, temperature 98.3 F (36.8 C), height 5' 8 (1.727 m), weight 231 lb (104.8 kg), last menstrual period 12/25/2023, SpO2 99%. Body mass index is 35.12 kg/m.  General: She is overweight, cooperative, alert, well developed,  and in no acute distress. PSYCH: Has normal mood, affect and thought process.   Extremities: No edema.  Neurologic: No gross sensory or motor deficits. No tremors or fasciculations noted.    DIAGNOSTIC DATA REVIEWED:  BMET    Component Value Date/Time   NA 137 11/20/2022 1234   K 4.2 11/20/2022 1234   CL 102 11/20/2022 1234   CO2 21 11/20/2022 1234   GLUCOSE 104 (H) 11/20/2022 1234   GLUCOSE 91 03/20/2016 0925   BUN 9 11/20/2022 1234   CREATININE 0.70 11/20/2022 1234   CREATININE 0.68 03/20/2016 0925   CALCIUM 9.3 11/20/2022 1234   GFRNONAA >89 03/20/2016 0925   GFRAA >89 03/20/2016 0925   Lab Results  Component Value Date   HGBA1C 4.9 03/14/2023   HGBA1C 5.0 02/24/2013   Lab Results  Component Value Date   INSULIN  8.9 04/03/2022   Lab Results  Component Value Date   TSH 2.280 03/14/2023   CBC    Component Value Date/Time   WBC 5.0 03/12/2022 0920   RBC 4.51 03/12/2022 0920   HGB 14.0 03/12/2022 0920   HCT 40.2 03/12/2022 0920   PLT 189 03/12/2022 0920   MCV 89 03/12/2022 0920   MCH 31.0 03/12/2022 0920   MCHC 34.8 03/12/2022 0920   RDW 12.9 03/12/2022 0920   Iron Studies No results found for: IRON, TIBC, FERRITIN, IRONPCTSAT Lipid Panel     Component Value Date/Time  CHOL 188 03/14/2023 0821   TRIG 126 03/14/2023 0821   HDL 60 03/14/2023 0821   CHOLHDL 3.1 03/14/2023 0821   CHOLHDL 2.6 03/20/2016 0925   VLDL 20 03/20/2016 0925   LDLCALC 106 (H) 03/14/2023 0821   Hepatic Function Panel     Component Value Date/Time   PROT 7.0 11/20/2022 1234   ALBUMIN 4.1 11/20/2022 1234   AST 17 11/20/2022 1234   ALT 30 11/20/2022 1234   ALKPHOS 60 11/20/2022 1234   BILITOT 0.4 11/20/2022 1234      Component Value Date/Time   TSH 2.280 03/14/2023 0821   Nutritional Lab Results  Component Value Date   VD25OH 58.6 11/20/2022   VD25OH 28.5 (L) 04/03/2022     ASSESSMENT AND PLAN  TREATMENT PLAN FOR OBESITY:  Recommended Dietary  Goals  Tracy Ford is currently in the action stage of change. As such, her goal is to continue weight management plan. She has agreed to keeping a food journal and adhering to recommended goals of 1500-1600 calories and 90+ grams of protein.  Behavioral Intervention  We discussed the following Behavioral Modification Strategies today: increasing lean protein intake to established goals, decreasing simple carbohydrates , increasing vegetables, increasing fiber rich foods, increasing water intake , continue to work on maintaining a reduced calorie state, getting the recommended amount of protein, incorporating whole foods, making healthy choices, staying well hydrated and practicing mindfulness when eating., and increase protein intake, fibrous foods (25 grams per day for women, 30 grams for men) and water to improve satiety and decrease hunger signals. .  Additional resources provided today: NA  Recommended Physical Activity Goals  Tracy Ford has been advised to work up to 150 minutes of moderate intensity aerobic activity a week and strengthening exercises 2-3 times per week for cardiovascular health, weight loss maintenance and preservation of muscle mass.   She has agreed to Think about enjoyable ways to increase daily physical activity and overcoming barriers to exercise, Increase physical activity in their day and reduce sedentary time (increase NEAT)., Work on scheduling and tracking physical activity. , and Combine aerobic and strengthening exercises for efficiency and improved cardiometabolic health.   Pharmacotherapy We discussed various medication options to help Tracy Ford with her weight loss efforts and we both agreed to continue Zepbound  7.5mg . Side effects discussed.  ASSOCIATED CONDITIONS ADDRESSED TODAY  Action/Plan  Polyphagia -     Zepbound ; Inject 7.5 mg into the skin once a week.  Dispense: 2 mL; Refill: 0  SOB (shortness of breath) Worsening Work on increasing protein  intake, water intake and exercising-cardio and resistance training  Hyperlipidemia, unspecified hyperlipidemia type -     Lipid Panel With LDL/HDL Ratio  Vitamin D  deficiency -     VITAMIN D  25 Hydroxy (Vit-D Deficiency, Fractures)  Medication management -     Comprehensive metabolic panel with GFR -     CBC with Differential/Platelet  Obesity, Class II, BMI 35-39.9 -     Zepbound ; Inject 7.5 mg into the skin once a week.  Dispense: 2 mL; Refill: 0         Return in about 4 weeks (around 02/12/2024).SABRA She was informed of the importance of frequent follow up visits to maximize her success with intensive lifestyle modifications for her multiple health conditions.   ATTESTASTION STATEMENTS:  Reviewed by clinician on day of visit: allergies, medications, problem list, medical history, surgical history, family history, social history, and previous encounter notes.     Corean SAUNDERS. Armani Gawlik FNP-C

## 2024-01-16 LAB — LIPID PANEL WITH LDL/HDL RATIO
Cholesterol, Total: 195 mg/dL (ref 100–199)
HDL: 64 mg/dL (ref >39)
LDL Chol Calc (NIH): 106 mg/dL — ABNORMAL HIGH (ref 0–99)
LDL/HDL Ratio: 1.7 ratio (ref 0.0–3.2)
Triglycerides: 143 mg/dL (ref 0–149)
VLDL Cholesterol Cal: 25 mg/dL (ref 5–40)

## 2024-01-16 LAB — COMPREHENSIVE METABOLIC PANEL WITH GFR
ALT: 20 IU/L (ref 0–32)
AST: 16 IU/L (ref 0–40)
Albumin: 4.4 g/dL (ref 3.9–4.9)
Alkaline Phosphatase: 52 IU/L (ref 41–116)
BUN/Creatinine Ratio: 15 (ref 9–23)
BUN: 10 mg/dL (ref 6–24)
Bilirubin Total: 0.5 mg/dL (ref 0.0–1.2)
CO2: 18 mmol/L — ABNORMAL LOW (ref 20–29)
Calcium: 9.2 mg/dL (ref 8.7–10.2)
Chloride: 104 mmol/L (ref 96–106)
Creatinine, Ser: 0.68 mg/dL (ref 0.57–1.00)
Globulin, Total: 2.3 g/dL (ref 1.5–4.5)
Glucose: 83 mg/dL (ref 70–99)
Potassium: 4.3 mmol/L (ref 3.5–5.2)
Sodium: 139 mmol/L (ref 134–144)
Total Protein: 6.7 g/dL (ref 6.0–8.5)
eGFR: 107 mL/min/1.73 (ref >59)

## 2024-01-16 LAB — CBC WITH DIFFERENTIAL/PLATELET
Basophils Absolute: 0.1 x10E3/uL (ref 0.0–0.2)
Basos: 1 %
EOS (ABSOLUTE): 0.2 x10E3/uL (ref 0.0–0.4)
Eos: 5 %
Hematocrit: 39.4 % (ref 34.0–46.6)
Hemoglobin: 13.3 g/dL (ref 11.1–15.9)
Immature Grans (Abs): 0 x10E3/uL (ref 0.0–0.1)
Immature Granulocytes: 0 %
Lymphocytes Absolute: 1.1 x10E3/uL (ref 0.7–3.1)
Lymphs: 21 %
MCH: 31.4 pg (ref 26.6–33.0)
MCHC: 33.8 g/dL (ref 31.5–35.7)
MCV: 93 fL (ref 79–97)
Monocytes Absolute: 0.4 x10E3/uL (ref 0.1–0.9)
Monocytes: 8 %
Neutrophils Absolute: 3.3 x10E3/uL (ref 1.4–7.0)
Neutrophils: 65 %
Platelets: 164 x10E3/uL (ref 150–450)
RBC: 4.23 x10E6/uL (ref 3.77–5.28)
RDW: 12.7 % (ref 11.7–15.4)
WBC: 5.1 x10E3/uL (ref 3.4–10.8)

## 2024-01-16 LAB — VITAMIN D 25 HYDROXY (VIT D DEFICIENCY, FRACTURES): Vit D, 25-Hydroxy: 32.7 ng/mL (ref 30.0–100.0)

## 2024-02-12 ENCOUNTER — Ambulatory Visit (INDEPENDENT_AMBULATORY_CARE_PROVIDER_SITE_OTHER): Admitting: Nurse Practitioner

## 2024-02-12 ENCOUNTER — Encounter: Payer: Self-pay | Admitting: Nurse Practitioner

## 2024-02-12 VITALS — BP 122/82 | HR 80 | Temp 98.2°F | Ht 68.0 in | Wt 233.0 lb

## 2024-02-12 DIAGNOSIS — E559 Vitamin D deficiency, unspecified: Secondary | ICD-10-CM | POA: Diagnosis not present

## 2024-02-12 DIAGNOSIS — Z6835 Body mass index (BMI) 35.0-35.9, adult: Secondary | ICD-10-CM | POA: Diagnosis not present

## 2024-02-12 DIAGNOSIS — E66812 Obesity, class 2: Secondary | ICD-10-CM | POA: Diagnosis not present

## 2024-02-12 DIAGNOSIS — R632 Polyphagia: Secondary | ICD-10-CM | POA: Diagnosis not present

## 2024-02-12 MED ORDER — ZEPBOUND 7.5 MG/0.5ML ~~LOC~~ SOAJ: 7.5000 mg | SUBCUTANEOUS | 0 refills | Status: DC

## 2024-02-12 NOTE — Progress Notes (Signed)
 Office: [phone]  /  Fax: [phone]  WEIGHT SUMMARY AND BIOMETRICS  Weight Lost Since Last Visit: 0lb  Weight Gained Since Last Visit: 2lb   Vitals Temp: 98.2 F (36.8 C) BP: 122/82 Pulse Rate: 80 SpO2: 100 %   Anthropometric Measurements Height: 5' 8 (1.727 m) Weight: 233 lb (105.7 kg) BMI (Calculated): 35.44 Weight at Last Visit: 231lb Weight Lost Since Last Visit: 0lb Weight Gained Since Last Visit: 2lb Starting Weight: 262lb Total Weight Loss (lbs): 29 lb (13.2 kg)   Body Composition  Body Fat %: 43.1 % Fat Mass (lbs): 100.6 lbs Muscle Mass (lbs): 126 lbs Total Body Water (lbs): 93.6 lbs Visceral Fat Rating : 11   Other Clinical Data Fasting: No Labs: No Today's Visit #: 24 Starting Date: 04/03/22     HPI  Chief Complaint: OBESITY  Tracy Ford is here to discuss her progress with her obesity treatment plan. She is on the the Category 3 Plan and states she is following her eating plan approximately 60-70 % of the time. She states she is exercising 0 minutes 0 days per week.   Interval History:  Since last office visit she has gained 2 pounds.  She has increased her protein intake and has decreased her carbs intake.  She is snacking on chick peas, protein shakes, deli meat.  She is drinking water daily.  She has walking more since her last visit.   She is going [place] the week after Christmas.    Her highest weight was 264 lbs.    Pharmacotherapy for weight loss: She is currently taking Zepbound  7.5 mg for medical weight loss.  Denies side effects.  Has helped with polyphagia and craving with increasing the dose.     Zepbound  approved 11/06/23-11/06/24  Vit D deficiency  She is taking Vit D 50,000 IU weekly but not on a regular basis.  Denies side effects.  Denies nausea, vomiting or muscle weakness.    Lab Results  Component Value Date   VD25OH 32.7 01/15/2024   VD25OH 58.6 11/20/2022   VD25OH 28.5 (L) 04/03/2022     PHYSICAL  EXAM:  Blood pressure 122/82, pulse 80, temperature 98.2 F (36.8 C), height 5' 8 (1.727 m), weight 233 lb (105.7 kg), last menstrual period 01/27/2024, SpO2 100%. Body mass index is 35.43 kg/m.  General: She is overweight, cooperative, alert, well developed, and in no acute distress. PSYCH: Has normal mood, affect and thought process.   Extremities: No edema.  Neurologic: No gross sensory or motor deficits. No tremors or fasciculations noted.    DIAGNOSTIC DATA REVIEWED:  BMET    Component Value Date/Time   NA 139 01/15/2024 0933   K 4.3 01/15/2024 0933   CL 104 01/15/2024 0933   CO2 18 (L) 01/15/2024 0933   GLUCOSE 83 01/15/2024 0933   GLUCOSE 91 03/20/2016 0925   BUN 10 01/15/2024 0933   CREATININE 0.68 01/15/2024 0933   CREATININE 0.68 03/20/2016 0925   CALCIUM 9.2 01/15/2024 0933   GFRNONAA >89 03/20/2016 0925   GFRAA >89 03/20/2016 0925   Lab Results  Component Value Date   HGBA1C 4.9 03/14/2023   HGBA1C 5.0 02/24/2013   Lab Results  Component Value Date   INSULIN  8.9 04/03/2022   Lab Results  Component Value Date   TSH 2.280 03/14/2023   CBC    Component Value Date/Time   WBC 5.1 01/15/2024 0933   RBC 4.23 01/15/2024 0933   HGB 13.3 01/15/2024 0933   HCT 39.4 01/15/2024  0933   PLT 164 01/15/2024 0933   MCV 93 01/15/2024 0933   MCH 31.4 01/15/2024 0933   MCHC 33.8 01/15/2024 0933   RDW 12.7 01/15/2024 0933   Iron Studies No results found for: IRON, TIBC, FERRITIN, IRONPCTSAT Lipid Panel     Component Value Date/Time   CHOL 195 01/15/2024 0933   TRIG 143 01/15/2024 0933   HDL 64 01/15/2024 0933   CHOLHDL 3.1 03/14/2023 0821   CHOLHDL 2.6 03/20/2016 0925   VLDL 20 03/20/2016 0925   LDLCALC 106 (H) 01/15/2024 0933   Hepatic Function Panel     Component Value Date/Time   PROT 6.7 01/15/2024 0933   ALBUMIN 4.4 01/15/2024 0933   AST 16 01/15/2024 0933   ALT 20 01/15/2024 0933   ALKPHOS 52 01/15/2024 0933   BILITOT 0.5 01/15/2024  0933      Component Value Date/Time   TSH 2.280 03/14/2023 0821   Nutritional Lab Results  Component Value Date   VD25OH 32.7 01/15/2024   VD25OH 58.6 11/20/2022   VD25OH 28.5 (L) 04/03/2022     ASSESSMENT AND PLAN  TREATMENT PLAN FOR OBESITY:  Recommended Dietary Goals  [name] is currently in the action stage of change. As such, her goal is to continue weight management plan. She has agreed to practicing portion control and making smarter food choices, such as increasing vegetables and decreasing simple carbohydrates.  Behavioral Intervention  We discussed the following Behavioral Modification Strategies today: increasing lean protein intake to established goals, decreasing simple carbohydrates , increasing vegetables, increasing fiber rich foods, increasing water intake , work on meal planning and preparation, reading food labels , keeping healthy foods at home, celebration eating strategies, continue to work on maintaining a reduced calorie state, getting the recommended amount of protein, incorporating whole foods, making healthy choices, staying well hydrated and practicing mindfulness when eating., and increase protein intake, fibrous foods (25 grams per day for women, 30 grams for men) and water to improve satiety and decrease hunger signals. .  Additional resources provided today: NA  Recommended Physical Activity Goals  Dionna has been advised to work up to 150 minutes of moderate intensity aerobic activity a week and strengthening exercises 2-3 times per week for cardiovascular health, weight loss maintenance and preservation of muscle mass.   She has agreed to Think about enjoyable ways to increase daily physical activity and overcoming barriers to exercise, Increase physical activity in their day and reduce sedentary time (increase NEAT)., Continue to gradually increase the amount and intensity of exercise routine, Increase volume of physical activity to a goal of  240 minutes a week, and Combine aerobic and strengthening exercises for efficiency and improved cardiometabolic health.   Pharmacotherapy We discussed various medication options to help Eiko with her weight loss efforts and we both agreed to continue Zepbound  7.45mg . side effects discussed.  ASSOCIATED CONDITIONS ADDRESSED TODAY  Action/Plan  Vitamin D  deficiency Take Vit D 50,000 international units  weekly.   Polyphagia -     Zepbound ; Inject 7.5 mg into the skin once a week.  Dispense: 2 mL; Refill: 0  Obesity, Class II, BMI 35-39.9 -     Zepbound ; Inject 7.5 mg into the skin once a week.  Dispense: 2 mL; Refill: 0     Labs reviewed in chart with patient from 01/15/24  Bio Impedance reviewed with patient Body fat % decreased Muscle mass increased    Return in about 4 weeks (around 03/11/2024).SABRA She was informed of the importance of  frequent follow up visits to maximize her success with intensive lifestyle modifications for her multiple health conditions.   ATTESTASTION STATEMENTS:  Reviewed by clinician on day of visit: allergies, medications, problem list, medical history, surgical history, family history, social history, and previous encounter notes.   \\   Ziair Penson R. Chihiro Frey FNP-C

## 2024-03-18 ENCOUNTER — Telehealth: Payer: Self-pay

## 2024-03-18 ENCOUNTER — Encounter: Payer: Self-pay | Admitting: Nurse Practitioner

## 2024-03-18 ENCOUNTER — Ambulatory Visit (INDEPENDENT_AMBULATORY_CARE_PROVIDER_SITE_OTHER): Admitting: Nurse Practitioner

## 2024-03-18 VITALS — BP 129/84 | HR 81 | Temp 98.0°F | Ht 68.0 in | Wt 232.0 lb

## 2024-03-18 DIAGNOSIS — E559 Vitamin D deficiency, unspecified: Secondary | ICD-10-CM

## 2024-03-18 DIAGNOSIS — Z6835 Body mass index (BMI) 35.0-35.9, adult: Secondary | ICD-10-CM

## 2024-03-18 DIAGNOSIS — E66812 Obesity, class 2: Secondary | ICD-10-CM | POA: Diagnosis not present

## 2024-03-18 DIAGNOSIS — R632 Polyphagia: Secondary | ICD-10-CM

## 2024-03-18 MED ORDER — VITAMIN D (ERGOCALCIFEROL) 1.25 MG (50000 UNIT) PO CAPS: 50000.0000 [IU] | ORAL_CAPSULE | ORAL | 0 refills | Status: AC

## 2024-03-18 MED ORDER — ZEPBOUND 10 MG/0.5ML ~~LOC~~ SOAJ: 10.0000 mg | SUBCUTANEOUS | 0 refills | Status: AC

## 2024-03-18 NOTE — Progress Notes (Signed)
 "  Office: [phone]  /  Fax: [phone]  WEIGHT SUMMARY AND BIOMETRICS  Weight Lost Since Last Visit: 1lb  Weight Gained Since Last Visit: 0lb   Vitals Temp: 98 F (36.7 C) BP: 129/84 Pulse Rate: 81 SpO2: 100 %   Anthropometric Measurements Height: 5' 8 (1.727 m) Weight: 232 lb (105.2 kg) BMI (Calculated): 35.28 Weight at Last Visit: 233lb Weight Lost Since Last Visit: 1lb Weight Gained Since Last Visit: 0lb Starting Weight: 262lb Total Weight Loss (lbs): 30 lb (13.6 kg)   Body Composition  Body Fat %: 43.8 % Fat Mass (lbs): 101.8 lbs Muscle Mass (lbs): 124 lbs Total Body Water (lbs): 95.4 lbs Visceral Fat Rating : 11   Other Clinical Data Fasting: Yes Labs: No Today's Visit #: 25 Starting Date: 04/03/22     HPI  Chief Complaint: OBESITY  Codi is here to discuss her progress with her obesity treatment plan. She is on the the Category 3 Plan and states she is following her eating plan approximately 75 % of the time. She states she is exercising 0 minutes 0 days per week.   Interval History:  Since last office visit she has lost 1 pound.  She has gone to [place] and celebrated Christmas since her last visit.  She was sick last week and hasn't been able to start exercising.  She is drinking water and milk daily and occ diet coke.    Her highest weight was 264 lbs.    Pharmacotherapy for weight loss: She is currently taking Zepbound  7.5 mg for medical weight loss.  Denies side effects.  Has helped with polyphagia and craving but has increased since her last visit.        Zepbound  approved 11/06/23-11/06/24  Vit D deficiency  She is taking Vit D 50,000 IU every 2 weeks.  Denies side effects.  Denies nausea, vomiting or muscle weakness.    Lab Results  Component Value Date   VD25OH 32.7 01/15/2024   VD25OH 58.6 11/20/2022   VD25OH 28.5 (L) 04/03/2022       PHYSICAL EXAM:  Blood pressure 129/84, pulse 81, temperature 98 F (36.7 C),  height 5' 8 (1.727 m), weight 232 lb (105.2 kg), last menstrual period 03/11/2024, SpO2 100%. Body mass index is 35.28 kg/m.  General: She is overweight, cooperative, alert, well developed, and in no acute distress. PSYCH: Has normal mood, affect and thought process.   Extremities: No edema.  Neurologic: No gross sensory or motor deficits. No tremors or fasciculations noted.    DIAGNOSTIC DATA REVIEWED:  BMET    Component Value Date/Time   NA 139 01/15/2024 0933   K 4.3 01/15/2024 0933   CL 104 01/15/2024 0933   CO2 18 (L) 01/15/2024 0933   GLUCOSE 83 01/15/2024 0933   GLUCOSE 91 03/20/2016 0925   BUN 10 01/15/2024 0933   CREATININE 0.68 01/15/2024 0933   CREATININE 0.68 03/20/2016 0925   CALCIUM 9.2 01/15/2024 0933   GFRNONAA >89 03/20/2016 0925   GFRAA >89 03/20/2016 0925   Lab Results  Component Value Date   HGBA1C 4.9 03/14/2023   HGBA1C 5.0 02/24/2013   Lab Results  Component Value Date   INSULIN  8.9 04/03/2022   Lab Results  Component Value Date   TSH 2.280 03/14/2023   CBC    Component Value Date/Time   WBC 5.1 01/15/2024 0933   RBC 4.23 01/15/2024 0933   HGB 13.3 01/15/2024 0933   HCT 39.4 01/15/2024 0933   PLT 164 01/15/2024  0933   MCV 93 01/15/2024 0933   MCH 31.4 01/15/2024 0933   MCHC 33.8 01/15/2024 0933   RDW 12.7 01/15/2024 0933   Iron Studies No results found for: IRON, TIBC, FERRITIN, IRONPCTSAT Lipid Panel     Component Value Date/Time   CHOL 195 01/15/2024 0933   TRIG 143 01/15/2024 0933   HDL 64 01/15/2024 0933   CHOLHDL 3.1 03/14/2023 0821   CHOLHDL 2.6 03/20/2016 0925   VLDL 20 03/20/2016 0925   LDLCALC 106 (H) 01/15/2024 0933   Hepatic Function Panel     Component Value Date/Time   PROT 6.7 01/15/2024 0933   ALBUMIN 4.4 01/15/2024 0933   AST 16 01/15/2024 0933   ALT 20 01/15/2024 0933   ALKPHOS 52 01/15/2024 0933   BILITOT 0.5 01/15/2024 0933      Component Value Date/Time   TSH 2.280 03/14/2023 0821    Nutritional Lab Results  Component Value Date   VD25OH 32.7 01/15/2024   VD25OH 58.6 11/20/2022   VD25OH 28.5 (L) 04/03/2022     ASSESSMENT AND PLAN  TREATMENT PLAN FOR OBESITY:  Recommended Dietary Goals  Lajuan is currently in the action stage of change. As such, her goal is to continue weight management plan. She has agreed to the Category 3 Plan.  Behavioral Intervention  We discussed the following Behavioral Modification Strategies today: increasing lean protein intake to established goals, decreasing simple carbohydrates , increasing vegetables, increasing fiber rich foods, increasing water intake , reading food labels , keeping healthy foods at home, planning for success, continue to work on maintaining a reduced calorie state, getting the recommended amount of protein, incorporating whole foods, making healthy choices, staying well hydrated and practicing mindfulness when eating., and increase protein intake, fibrous foods (25 grams per day for women, 30 grams for men) and water to improve satiety and decrease hunger signals. .  Additional resources provided today: NA  Recommended Physical Activity Goals  Sophina has been advised to work up to 150 minutes of moderate intensity aerobic activity a week and strengthening exercises 2-3 times per week for cardiovascular health, weight loss maintenance and preservation of muscle mass.   She has agreed to Think about enjoyable ways to increase daily physical activity and overcoming barriers to exercise, Increase physical activity in their day and reduce sedentary time (increase NEAT)., Work on scheduling and tracking physical activity. , and Combine aerobic and strengthening exercises for efficiency and improved cardiometabolic health.   Pharmacotherapy We discussed various medication options to help Aadvika with her weight loss efforts and we both agreed to increase Zepbound  10mg .  Side effects discussed.  ASSOCIATED  CONDITIONS ADDRESSED TODAY  Action/Plan  Vitamin D  deficiency -     Vitamin D  (Ergocalciferol ); Take 1 capsule (50,000 Units total) by mouth every 14 (fourteen) days.  Dispense: 6 capsule; Refill: 0  Polyphagia -     Zepbound ; Inject 10 mg into the skin once a week.  Dispense: 2 mL; Refill: 0  Obesity, Class II, BMI 35-39.9 -     Zepbound ; Inject 10 mg into the skin once a week.  Dispense: 2 mL; Refill: 0         Return in about 4 weeks (around 04/15/2024).Tracy Ford She was informed of the importance of frequent follow up visits to maximize her success with intensive lifestyle modifications for her multiple health conditions.   ATTESTASTION STATEMENTS:  Reviewed by clinician on day of visit: allergies, medications, problem list, medical history, surgical history, family history, social history, and  previous encounter notes.     Corean SAUNDERS. Dhillon Comunale FNP-C "

## 2024-03-18 NOTE — Telephone Encounter (Signed)
 PA submitted through [email or web address] for Zepbound , Awaiting insurance determination. EOC: [id number]

## 2024-03-23 NOTE — Telephone Encounter (Signed)
 Received fax from [employer] that Zepbound  has been approved through 09/20/24.

## 2024-04-22 ENCOUNTER — Ambulatory Visit: Admitting: Nurse Practitioner
